# Patient Record
Sex: Female | Born: 1940 | Race: Black or African American | Hispanic: No | Marital: Single | Smoking: Former smoker
Health system: Southern US, Community
[De-identification: ages and names within clinical notes are randomized; demographics above are authoritative.]

## PROBLEM LIST (undated history)

## (undated) DIAGNOSIS — I639 Cerebral infarction, unspecified: Secondary | ICD-10-CM

## (undated) DIAGNOSIS — I509 Heart failure, unspecified: Secondary | ICD-10-CM

## (undated) DIAGNOSIS — I10 Essential (primary) hypertension: Secondary | ICD-10-CM

## (undated) DIAGNOSIS — Z9181 History of falling: Secondary | ICD-10-CM

## (undated) DIAGNOSIS — F0390 Unspecified dementia without behavioral disturbance: Secondary | ICD-10-CM

## (undated) DIAGNOSIS — J449 Chronic obstructive pulmonary disease, unspecified: Secondary | ICD-10-CM

## (undated) DIAGNOSIS — R011 Cardiac murmur, unspecified: Secondary | ICD-10-CM

## (undated) DIAGNOSIS — I219 Acute myocardial infarction, unspecified: Secondary | ICD-10-CM

## (undated) HISTORY — DX: Essential (primary) hypertension: I10

## (undated) HISTORY — DX: History of falling: Z91.81

## (undated) HISTORY — DX: Cerebral infarction, unspecified: I63.9

## (undated) HISTORY — PX: ABDOMINAL HYSTERECTOMY: SHX81

## (undated) HISTORY — DX: Acute myocardial infarction, unspecified: I21.9

## (undated) HISTORY — DX: Unspecified dementia, unspecified severity, without behavioral disturbance, psychotic disturbance, mood disturbance, and anxiety: F03.90

## (undated) HISTORY — DX: Cardiac murmur, unspecified: R01.1

---

## 2011-04-03 DIAGNOSIS — I509 Heart failure, unspecified: Secondary | ICD-10-CM | POA: Diagnosis not present

## 2011-04-03 DIAGNOSIS — J449 Chronic obstructive pulmonary disease, unspecified: Secondary | ICD-10-CM | POA: Diagnosis not present

## 2011-04-03 DIAGNOSIS — I10 Essential (primary) hypertension: Secondary | ICD-10-CM | POA: Diagnosis not present

## 2011-04-03 DIAGNOSIS — E785 Hyperlipidemia, unspecified: Secondary | ICD-10-CM | POA: Diagnosis not present

## 2011-07-05 DIAGNOSIS — R0602 Shortness of breath: Secondary | ICD-10-CM | POA: Diagnosis not present

## 2011-07-05 DIAGNOSIS — I10 Essential (primary) hypertension: Secondary | ICD-10-CM | POA: Diagnosis not present

## 2011-07-05 DIAGNOSIS — R0609 Other forms of dyspnea: Secondary | ICD-10-CM | POA: Diagnosis not present

## 2011-07-06 DIAGNOSIS — J438 Other emphysema: Secondary | ICD-10-CM | POA: Diagnosis not present

## 2011-07-06 DIAGNOSIS — R51 Headache: Secondary | ICD-10-CM | POA: Diagnosis not present

## 2011-07-07 DIAGNOSIS — R9431 Abnormal electrocardiogram [ECG] [EKG]: Secondary | ICD-10-CM | POA: Diagnosis not present

## 2011-07-07 DIAGNOSIS — R0602 Shortness of breath: Secondary | ICD-10-CM | POA: Diagnosis not present

## 2012-01-15 ENCOUNTER — Emergency Department (INDEPENDENT_AMBULATORY_CARE_PROVIDER_SITE_OTHER)
Admission: EM | Admit: 2012-01-15 | Discharge: 2012-01-15 | Disposition: A | Discharge status: Home / Self Care | Payer: Medicare Other | Source: Home / Self Care | Attending: Emergency Medicine | Admitting: Emergency Medicine

## 2012-01-15 ENCOUNTER — Encounter (HOSPITAL_COMMUNITY): Payer: Self-pay | Admitting: *Deleted

## 2012-01-15 DIAGNOSIS — I10 Essential (primary) hypertension: Secondary | ICD-10-CM

## 2012-01-15 DIAGNOSIS — J449 Chronic obstructive pulmonary disease, unspecified: Secondary | ICD-10-CM | POA: Diagnosis not present

## 2012-01-15 DIAGNOSIS — I509 Heart failure, unspecified: Secondary | ICD-10-CM

## 2012-01-15 HISTORY — DX: Heart failure, unspecified: I50.9

## 2012-01-15 HISTORY — DX: Chronic obstructive pulmonary disease, unspecified: J44.9

## 2012-01-15 MED ORDER — DONEPEZIL HCL 10 MG PO TABS: 10.0000 mg | ORAL_TABLET | Freq: Every evening | ORAL | Status: DC | PRN

## 2012-01-15 MED ORDER — CLONIDINE HCL 0.1 MG/24HR TD PTWK: 1.0000 | MEDICATED_PATCH | TRANSDERMAL | Status: DC

## 2012-01-15 MED ORDER — FUROSEMIDE 40 MG PO TABS: ORAL_TABLET | ORAL | Status: DC

## 2012-01-15 MED ORDER — CARVEDILOL PHOSPHATE ER 40 MG PO CP24: 40.0000 mg | ORAL_CAPSULE | Freq: Every day | ORAL | Status: DC

## 2012-01-15 MED ORDER — POTASSIUM CHLORIDE CRYS ER 20 MEQ PO TBCR: EXTENDED_RELEASE_TABLET | ORAL | Status: DC

## 2012-01-15 MED ORDER — LISINOPRIL 10 MG PO TABS: 10.0000 mg | ORAL_TABLET | Freq: Every day | ORAL | Status: DC

## 2012-01-15 MED ORDER — SIMVASTATIN 20 MG PO TABS: 20.0000 mg | ORAL_TABLET | Freq: Every evening | ORAL | Status: DC

## 2012-01-15 NOTE — ED Notes (Signed)
Pt     Short    Of  Breath         Has    History  Of  chf  In  Past  Recently   Moved  From   [place]          took  Lasix  yest             Pt    Sitting  Upright  On table  In  msome  Mild  Distress family  Member  At  Bedside

## 2012-01-15 NOTE — ED Provider Notes (Signed)
Chief Complaint  Patient presents with  . Shortness of Breath    History of Present Illness:   Carol Monroe is a 71 year old female who is visiting here from [place]. She comes in today with her daughter for refills on her medications. She is nearly out of a number of medications her daughter wants to get enough for several months since she may be staying here with her. She has a history of COPD, congestive heart failure, hypertension, hyperlipidemia, and dementia. She is on a number of medications as outlined below. She tolerated all these medications well. She and her daughter both say that she is at her baseline state right now. She does get fairly short of breath with minimal exertion, but not any more so than usual. She normally uses oxygen at home. She doesn't have any fever or chills. She has a cough productive of small amounts of white sputum but no wheezing. She denies any chest pain, pressure, or tightness. She's had no weakness, dizziness, or fainting. She denies any ankle edema, PND, orthopnea. Her weight has been stable.  Review of Systems:  Other than noted above, the patient denies any of the following symptoms. Systemic:  No fever, chills, sweats, fatigue, myalgias, headache, or anorexia. Eye:  No redness, pain or drainage. ENT:  No earache, nasal congestion, rhinorrhea, sinus pressure, or sore throat. Lungs:  No cough, sputum production, wheezing, shortness of breath.  Cardiovascular:  No chest pain, palpitations, or syncope. GI:  No nausea, vomiting, abdominal pain or diarrhea. GU:  No dysuria, frequency, or hematuria. Skin:  No rash or pruritis.  PMFSH:  Past medical history, family history, social history, meds, and allergies were reviewed.   Physical Exam:   Vital signs:  BP 189/95  Pulse 70  Temp 99.7 F (37.6 C) (Oral)  Resp 26  SpO2 94% General:  Alert, in no distress. Eye:  PERRL, full EOMs.  Lids and conjunctivas were normal. ENT:  TMs and canals were  normal, without erythema or inflammation.  Nasal mucosa was clear and uncongested, without drainage.  Mucous membranes were moist.  Pharynx was clear, without exudate or drainage.  There were no oral ulcerations or lesions. Neck:  Supple, no adenopathy, tenderness or mass. Thyroid was normal. Lungs:  No respiratory distress.  Lungs were clear to auscultation, without wheezes, rales or rhonchi.  Breath sounds were clear and equal bilaterally. Heart:  Regular rhythm, without gallops, murmers or rubs. Abdomen:  Soft, flat, and non-tender to palpation.  No hepatosplenomagaly or mass. Skin:  Clear, warm, and dry, without rash or lesions.  Assessment:  The primary encounter diagnosis was COPD (chronic obstructive pulmonary disease). Diagnoses of CHF (congestive heart failure) and Hypertension were also pertinent to this visit.  Plan:   1.  The following meds were prescribed:   New Prescriptions   CARVEDILOL (COREG CR) 40 MG 24 HR CAPSULE    Take 1 capsule (40 mg total) by mouth daily.   CLONIDINE (CATAPRES - DOSED IN MG/24 HR) 0.1 MG/24HR PATCH    Place 1 patch (0.1 mg total) onto the skin once a week.   DONEPEZIL (ARICEPT) 10 MG TABLET    Take 1 tablet (10 mg total) by mouth at bedtime as needed.   FUROSEMIDE (LASIX) 40 MG TABLET    One daily on Mon, Wed, Fri, and Sat.   LISINOPRIL (PRINIVIL,ZESTRIL) 10 MG TABLET    Take 1 tablet (10 mg total) by mouth daily.   POTASSIUM CHLORIDE SA (K-DUR,KLOR-CON) 20  MEQ TABLET    One daily Mon, Wed, Fri, and Sat.   SIMVASTATIN (ZOCOR) 20 MG TABLET    Take 1 tablet (20 mg total) by mouth every evening.   2.  The patient was instructed in symptomatic care and handouts were given. The daughter was told that if she is going to be staying here she'll need to find a primary care physician. She was given enough refills on all her meds to last for a month. The daughter knows if she should get worse in any way, particularly with increasing shortness of breath, change in  color of sputum, fever, chest pain, ankle edema, or weight gain, to take her to the emergency room. 3.  The patient was told to return if becoming worse in any way, if no better in 3 or 4 days, and given some red flag symptoms that would indicate earlier return.    Reuben Likes, MD 01/15/12 [phone]

## 2012-01-15 NOTE — ED Notes (Addendum)
Pt's O2 sats were 85% upon walking from waiting area to exam room 9. Resp rate & effort noted to be increased. Pt admits to being short of breath "just doing little walks." After pt sat approx 3 min, O2 sats highest were 94% on RA & RR down to 26. Dr. Lorenz Coaster made aware of this.

## 2012-04-06 DIAGNOSIS — Z0000 Encounter for general adult medical examination without abnormal findings: Secondary | ICD-10-CM | POA: Diagnosis not present

## 2012-04-09 ENCOUNTER — Ambulatory Visit: Payer: Self-pay | Admitting: Nurse Practitioner

## 2012-04-16 ENCOUNTER — Ambulatory Visit: Payer: Self-pay | Admitting: Nurse Practitioner

## 2012-04-24 ENCOUNTER — Encounter: Payer: Self-pay | Admitting: Nurse Practitioner

## 2012-04-24 ENCOUNTER — Ambulatory Visit (INDEPENDENT_AMBULATORY_CARE_PROVIDER_SITE_OTHER): Payer: Medicare Other | Admitting: Nurse Practitioner

## 2012-04-24 VITALS — BP 146/92 | HR 62 | Temp 98.6°F | Resp 22 | Ht 61.5 in | Wt 159.0 lb

## 2012-04-24 DIAGNOSIS — H6120 Impacted cerumen, unspecified ear: Secondary | ICD-10-CM

## 2012-04-24 DIAGNOSIS — I10 Essential (primary) hypertension: Secondary | ICD-10-CM | POA: Insufficient documentation

## 2012-04-24 DIAGNOSIS — R413 Other amnesia: Secondary | ICD-10-CM | POA: Diagnosis not present

## 2012-04-24 DIAGNOSIS — J449 Chronic obstructive pulmonary disease, unspecified: Secondary | ICD-10-CM

## 2012-04-24 DIAGNOSIS — I639 Cerebral infarction, unspecified: Secondary | ICD-10-CM

## 2012-04-24 DIAGNOSIS — I6350 Cerebral infarction due to unspecified occlusion or stenosis of unspecified cerebral artery: Secondary | ICD-10-CM | POA: Diagnosis not present

## 2012-04-24 DIAGNOSIS — F0390 Unspecified dementia without behavioral disturbance: Secondary | ICD-10-CM

## 2012-04-24 DIAGNOSIS — I509 Heart failure, unspecified: Secondary | ICD-10-CM | POA: Insufficient documentation

## 2012-04-24 DIAGNOSIS — H6123 Impacted cerumen, bilateral: Secondary | ICD-10-CM

## 2012-04-24 MED ORDER — DONEPEZIL HCL 10 MG PO TABS: 10.0000 mg | ORAL_TABLET | Freq: Every day | ORAL | Status: DC

## 2012-04-24 MED ORDER — CLONIDINE HCL 0.1 MG/24HR TD PTWK: 1.0000 | MEDICATED_PATCH | TRANSDERMAL | Status: DC

## 2012-04-24 NOTE — Assessment & Plan Note (Signed)
Wax removal done. Pt tolerated well.

## 2012-04-24 NOTE — Progress Notes (Addendum)
Patient ID: Carol Monroe, female   DOB: 08/23/1940, 72 y.o.   MRN: [id number] Code Status: Full code   No Known Allergies  Chief Complaint  Patient presents with  . NP to Establish    HPI: Patient is a 72 y.o. female seen in the office today to establish care Previously lived in [place] with her other daughter (pt has 4 daughters)  Came to [place] to visit but now she plans on staying Daughter here with pt during the visit and reports insurance is here is not covering the same medications   Review of Systems:  Review of Systems  Constitutional: Negative for fever, chills, weight loss and malaise/fatigue.  HENT: Negative for hearing loss, ear pain, nosebleeds, congestion, sore throat and tinnitus.   Eyes: Positive for blurred vision (ongoing- gradual loss of vision). Negative for double vision, pain, discharge and redness.  Respiratory: Negative for cough, sputum production and shortness of breath.   Cardiovascular: Negative for chest pain, palpitations, orthopnea, claudication and leg swelling.  Gastrointestinal: Negative for heartburn, nausea, vomiting, abdominal pain, diarrhea and constipation.  Genitourinary: Positive for frequency (on diuretics ).       Some urge incontinence   Musculoskeletal: Negative.   Skin: Negative for itching and rash.  Neurological: Negative for dizziness, tingling, weakness and headaches.       Hx of carpel tunnel   Psychiatric/Behavioral: Positive for memory loss. Negative for depression. The patient has insomnia (watches TV at night). The patient is not nervous/anxious.      Past Medical History  Diagnosis Date  . CHF (congestive heart failure)   . COPD (chronic obstructive pulmonary disease)   . Stroke   . Hypertension   . Heart attack   . Murmur, heart   . Dementia    Past Surgical History  Procedure Laterality Date  . Abdominal hysterectomy      Partial    Social History:   reports that she quit smoking about 6 years ago. Her smoking use  included Cigarettes. She has a 80 pack-year smoking history. She does not have any smokeless tobacco history on file. She reports that she does not drink alcohol or use illicit drugs.  Family History  Problem Relation Age of Onset  . Heart disease Mother   . Diabetes Mother   . Diabetes Father     Medications: Patient's Medications  New Prescriptions   No medications on file  Previous Medications   CARVEDILOL (COREG) 12.5 MG TABLET    Take one tablet by mouth once twice daily   CLONIDINE (CATAPRES - DOSED IN MG/24 HR) 0.1 MG/24HR PATCH    Place 1 patch onto the skin once a week.   DONEPEZIL (ARICEPT) 10 MG TABLET    Take 10 mg by mouth at bedtime as needed.   FUROSEMIDE (LASIX) 40 MG TABLET    One daily on Mon, Wed, Fri, and Sat.   POTASSIUM CHLORIDE SA (K-DUR,KLOR-CON) 20 MEQ TABLET    One daily Mon, Wed, Fri, and Sat.   SIMVASTATIN (ZOCOR) 20 MG TABLET    Take 20 mg by mouth every evening.   TIOTROPIUM (SPIRIVA HANDIHALER) 18 MCG INHALATION CAPSULE    Place 18 mcg into inhaler and inhale daily.  Modified Medications   Modified Medication Previous Medication   LISINOPRIL (PRINIVIL,ZESTRIL) 10 MG TABLET lisinopril (PRINIVIL,ZESTRIL) 10 MG tablet      Take 40 mg by mouth daily.    Take 1 tablet (10 mg total) by mouth daily.  Discontinued Medications  CARVEDILOL (COREG CR) 40 MG 24 HR CAPSULE    Take 40 mg by mouth daily.   CARVEDILOL (COREG CR) 40 MG 24 HR CAPSULE    Take 1 capsule (40 mg total) by mouth daily.   CLONIDINE (CATAPRES - DOSED IN MG/24 HR) 0.1 MG/24HR PATCH    Place 1 patch (0.1 mg total) onto the skin once a week.   DONEPEZIL (ARICEPT) 10 MG TABLET    Take 1 tablet (10 mg total) by mouth at bedtime as needed.   FUROSEMIDE (LASIX) 40 MG TABLET    Take 40 mg by mouth daily. mwfs   LISINOPRIL (PRINIVIL,ZESTRIL) 10 MG TABLET    Take 40 mg by mouth daily.   POTASSIUM CHLORIDE (KLOR-CON PO)    Take by mouth.   SIMVASTATIN (ZOCOR) 20 MG TABLET    Take 1 tablet (20 mg total)  by mouth every evening.     Physical Exam: Physical Exam  Constitutional: She appears well-developed and well-nourished. No distress.  HENT:  Head: Normocephalic and atraumatic.  Right Ear: External ear normal.  Left Ear: External ear normal.  Bilateral cerumen impaction   Eyes: Conjunctivae and EOM are normal. Pupils are equal, round, and reactive to light.  Neck: Normal range of motion. Neck supple.  Cardiovascular: Normal rate, regular rhythm and normal heart sounds.   Pulmonary/Chest: Effort normal and breath sounds normal.  Abdominal: Soft. Bowel sounds are normal.  Musculoskeletal: Normal range of motion.  Neurological: She is alert. She has normal reflexes.  Skin: Skin is warm and dry. She is not diaphoretic.  Psychiatric: She has a normal mood and affect.    Filed Vitals:   04/24/12 1355 04/24/12 1457  BP: 168/100 146/92  Pulse: 62   Temp: 98.6 F (37 C)   TempSrc: Oral   Resp: 22   Height: 5' 1.5" (1.562 m)   Weight: 159 lb (72.122 kg)   SpO2: 95%    Assessment/Plan Hypertension BP high today however she has not taken any of her BP medications. Will have pt take bp out of the office and bring log to ext visit to call if sbp remaining over 150 once she takes her medication.   Stroke With no residuals- will request records from previous PCP  CHF (congestive heart failure) Patient is stable; continue current regimen. Will monitor and make changes as necessary.  COPD (chronic obstructive pulmonary disease) Patient is stable; continue current regimen. Will monitor and make changes as necessary.   Dementia Stable-- currently on aricept Will get labs and MMSE at [facility]  Cerumen impaction  Wax removal done. Pt tolerated well.

## 2012-04-24 NOTE — Patient Instructions (Signed)
Cardiac Diet This diet can help prevent heart disease and stroke. Many factors influence your heart health, including eating and exercise habits. Coronary risk rises a lot with abnormal blood fat (lipid) levels. Cardiac meal planning includes limiting unhealthy fats, increasing healthy fats, and making other small dietary changes. General guidelines are as follows:  Adjust calorie intake to reach and maintain desirable body weight.  Limit total fat intake to less than 30% of total calories. Saturated fat should be less than 7% of calories.  Saturated fats are found in animal products and in some vegetable products. Saturated vegetable fats are found in coconut oil, cocoa butter, palm oil, and palm kernel oil. Read labels carefully to avoid these products as much as possible. Use butter in moderation. Choose tub margarines and oils that have 2 grams of fat or less. Good cooking oils are canola and olive oils.  Practice low-fat cooking techniques. Do not fry food. Instead, broil, bake, boil, steam, grill, roast on a rack, stir-fry, or microwave it. Other fat reducing suggestions include:  Remove the skin from poultry.  Remove all visible fat from meats.  Skim the fat off stews, soups, and gravies before serving them.  Steam vegetables in water or broth instead of sauting them in fat.  Avoid foods with trans fat (or hydrogenated oils), such as commercially fried foods and commercially baked goods. Commercial shortening and deep-frying fats will contain trans fat.  Increase intake of fruits, vegetables, whole grains, and legumes to replace foods high in fat.  Increase consumption of nuts, legumes, and seeds to at least 4 servings weekly. One serving of a legume equals  cup, and 1 serving of nuts or seeds equals  cup.  Choose whole grains more often. Have 3 servings per day (a serving is 1 ounce [oz]).  Eat 4 to 5 servings of vegetables per day. A serving of vegetables is 1 cup of raw leafy  vegetables;  cup of raw or cooked cut-up vegetables;  cup of vegetable juice.  Eat 4 to 5 servings of fruit per day. A serving of fruit is 1 medium whole fruit;  cup of dried fruit;  cup of fresh, frozen, or canned fruit;  cup of 100% fruit juice.  Increase your intake of dietary fiber to 20 to 30 grams per day. Insoluble fiber may help lower your risk of heart disease and may help curb your appetite. Soluble fiber binds cholesterol to be removed from the blood. Foods high in soluble fiber are dried beans, citrus fruits, oats, apples, bananas, broccoli, Brussels sprouts, and eggplant.  Try to include foods fortified with plant sterols or stanols, such as yogurt, breads, juices, or margarines. Choose several fortified foods to achieve a daily intake of 2 to 3 grams of plant sterols or stanols.  Foods with omega-3 fats can help reduce your risk of heart disease. Aim to have a 3.5 oz portion of fatty fish twice per week, such as salmon, mackerel, albacore tuna, sardines, lake trout, or herring. If you wish to take a fish oil supplement, choose one that contains 1 gram of both DHA and EPA.  Limit processed meats to 2 servings (3 oz portion) weekly.  Limit the sodium in your diet to 1500 milligrams (mg) per day. If you have high blood pressure, talk to a registered dietitian about a DASH (Dietary Approaches to Stop Hypertension) eating plan.  Limit sweets and beverages with added sugar, such as soda, to no more than 5 servings per week. One serving   is:   1 tablespoon sugar.  1 tablespoon jelly or jam.   cup sorbet.  1 cup lemonade.   cup regular soda. CHOOSING FOODS Starches  Allowed: Breads: All kinds (wheat, rye, raisin, white, oatmeal, Italian, French, and English muffin bread). Low-fat rolls: English muffins, frankfurter and hamburger buns, bagels, pita bread, tortillas (not fried). Pancakes, waffles, biscuits, and muffins made with recommended oil.  Avoid: Products made with  saturated or trans fats, oils, or whole milk products. Butter rolls, cheese breads, croissants. Commercial doughnuts, muffins, sweet rolls, biscuits, waffles, pancakes, store-bought mixes. Crackers  Allowed: Low-fat crackers and snacks: Animal, graham, rye, saltine (with recommended oil, no lard), oyster, and matzo crackers. Bread sticks, melba toast, rusks, flatbread, pretzels, and light popcorn.  Avoid: High-fat crackers: cheese crackers, butter crackers, and those made with coconut, palm oil, or trans fat (hydrogenated oils). Buttered popcorn. Cereals  Allowed: Hot or cold whole-grain cereals.  Avoid: Cereals containing coconut, hydrogenated vegetable fat, or animal fat. Potatoes / Pasta / Rice  Allowed: All kinds of potatoes, rice, and pasta (such as macaroni, spaghetti, and noodles).  Avoid: Pasta or rice prepared with cream sauce or high-fat cheese. Chow mein noodles, French fries. Vegetables  Allowed: All vegetables and vegetable juices.  Avoid: Fried vegetables. Vegetables in cream, butter, or high-fat cheese sauces. Limit coconut. Fruit in cream or custard. Protein  Allowed: Limit your intake of meat, seafood, and poultry to no more than 6 oz (cooked weight) per day. All lean, well-trimmed beef, veal, pork, and lamb. All chicken and turkey without skin. All fish and shellfish. Wild game: wild duck, rabbit, pheasant, and venison. Egg whites or low-cholesterol egg substitutes may be used as desired. Meatless dishes: recipes with dried beans, peas, lentils, and tofu (soybean curd). Seeds and nuts: all seeds and most nuts.  Avoid: Prime grade and other heavily marbled and fatty meats, such as short ribs, spare ribs, rib eye roast or steak, frankfurters, sausage, bacon, and high-fat luncheon meats, mutton. Caviar. Commercially fried fish. Domestic duck, goose, venison sausage. Organ meats: liver, gizzard, heart, chitterlings, brains, kidney, sweetbreads. Dairy  Allowed: Low-fat  cheeses: nonfat or low-fat cottage cheese (1% or 2% fat), cheeses made with part skim milk, such as mozzarella, farmers, string, or ricotta. (Cheeses should be labeled no more than 2 to 6 grams fat per oz.). Skim (or 1%) milk: liquid, powdered, or evaporated. Buttermilk made with low-fat milk. Drinks made with skim or low-fat milk or cocoa. Chocolate milk or cocoa made with skim or low-fat (1%) milk. Nonfat or low-fat yogurt.  Avoid: Whole milk cheeses, including colby, cheddar, muenster, Monterey Jack, Havarti, Brie, Camembert, American, Swiss, and blue. Creamed cottage cheese, cream cheese. Whole milk and whole milk products, including buttermilk or yogurt made from whole milk, drinks made from whole milk. Condensed milk, evaporated whole milk, and 2% milk. Soups and Combination Foods  Allowed: Low-fat low-sodium soups: broth, dehydrated soups, homemade broth, soups with the fat removed, homemade cream soups made with skim or low-fat milk. Low-fat spaghetti, lasagna, chili, and Spanish rice if low-fat ingredients and low-fat cooking techniques are used.  Avoid: Cream soups made with whole milk, cream, or high-fat cheese. All other soups. Desserts and Sweets  Allowed: Sherbet, fruit ices, gelatins, meringues, and angel food cake. Homemade desserts with recommended fats, oils, and milk products. Jam, jelly, honey, marmalade, sugars, and syrups. Pure sugar candy, such as gum drops, hard candy, jelly beans, marshmallows, mints, and small amounts of dark chocolate.  Avoid: Commercially prepared cakes,   pies, cookies, frosting, pudding, or mixes for these products. Desserts containing whole milk products, chocolate, coconut, lard, palm oil, or palm kernel oil. Ice cream or ice cream drinks. Candy that contains chocolate, coconut, butter, hydrogenated fat, or unknown ingredients. Buttered syrups. Fats and Oils  Allowed: Vegetable oils: safflower, sunflower, corn, soybean, cottonseed, sesame, canola, olive,  or peanut. Non-hydrogenated margarines. Salad dressing or mayonnaise: homemade or commercial, made with a recommended oil. Low or nonfat salad dressing or mayonnaise.  Limit added fats and oils to 6 to 8 tsp per day (includes fats used in cooking, baking, salads, and spreads on bread). Remember to count the "hidden fats" in foods.  Avoid: Solid fats and shortenings: butter, lard, salt pork, bacon drippings. Gravy containing meat fat, shortening, or suet. Cocoa butter, coconut. Coconut oil, palm oil, palm kernel oil, or hydrogenated oils: these ingredients are often used in bakery products, nondairy creamers, whipped toppings, candy, and commercially fried foods. Read labels carefully. Salad dressings made of unknown oils, sour cream, or cheese, such as blue cheese and Roquefort. Cream, all kinds: half-and-half, light, heavy, or whipping. Sour cream or cream cheese (even if "light" or low-fat). Nondairy cream substitutes: coffee creamers and sour cream substitutes made with palm, palm kernel, hydrogenated oils, or coconut oil. Beverages  Allowed: Coffee (regular or decaffeinated), tea. Diet carbonated beverages, mineral water. Alcohol: Check with your caregiver. Moderation is recommended.  Avoid: Whole milk, regular sodas, and juice drinks with added sugar. Condiments  Allowed: All seasonings and condiments. Cocoa powder. "Cream" sauces made with recommended ingredients.  Avoid: Carob powder made with hydrogenated fats. SAMPLE MENU Breakfast   cup orange juice   cup oatmeal  1 slice toast  1 tsp margarine  1 cup skim milk Lunch  Turkey sandwich with 2 oz turkey, 2 slices bread  Lettuce and tomato slices  Fresh fruit  Carrot sticks  Coffee or tea Snack  Fresh fruit or low-fat crackers Dinner  3 oz lean ground beef  1 baked potato  1 tsp margarine   cup asparagus  Lettuce salad  1 tbs non-creamy dressing   cup peach slices  1 cup skim milk Document Released:  10/19/2007 Document Revised: 07/11/2011 Document Reviewed: 04/04/2011 [employer] Patient Information 2013 [employer].  

## 2012-04-24 NOTE — Assessment & Plan Note (Signed)
With no residuals- will request records from previous PCP

## 2012-04-24 NOTE — Assessment & Plan Note (Signed)
Patient is stable; continue current regimen. Will monitor and make changes as necessary.  

## 2012-04-24 NOTE — Assessment & Plan Note (Signed)
BP high today however she has not taken any of her BP medications. Will have pt take bp out of the office and bring log to ext visit to call if sbp remaining over 150 once she takes her medication.

## 2012-04-24 NOTE — Assessment & Plan Note (Signed)
Stable-- currently on aricept Will get labs and MMSE at [facility]

## 2012-04-25 ENCOUNTER — Telehealth: Payer: Self-pay | Admitting: Nurse Practitioner

## 2012-04-25 LAB — COMPREHENSIVE METABOLIC PANEL
ALT: 13 IU/L (ref 0–32)
AST: 18 IU/L (ref 0–40)
Albumin/Globulin Ratio: 1.1 (ref 1.1–2.5)
GFR calc Af Amer: 64 mL/min/{1.73_m2} (ref >59)
GFR calc non Af Amer: 55 mL/min/{1.73_m2} — ABNORMAL LOW (ref >59)
Potassium: 5 mmol/L (ref 3.5–5.2)
Sodium: 146 mmol/L — ABNORMAL HIGH (ref 134–144)
Total Bilirubin: 0.4 mg/dL (ref 0.0–1.2)

## 2012-04-25 LAB — CBC WITH DIFFERENTIAL/PLATELET
Eosinophils Absolute: 0.1 10*3/uL (ref 0.0–0.4)
Immature Grans (Abs): 0 10*3/uL (ref 0.0–0.1)
Lymphs: 33 % (ref 14–46)
MCHC: 31.5 g/dL (ref 31.5–35.7)
Monocytes Absolute: 0.7 10*3/uL (ref 0.1–0.9)
Monocytes: 9 % (ref 4–12)
Neutrophils Absolute: 4.4 10*3/uL (ref 1.4–7.0)
RDW: 16.4 % — ABNORMAL HIGH (ref 12.3–15.4)

## 2012-04-25 NOTE — Telephone Encounter (Signed)
Err

## 2012-04-25 NOTE — Addendum Note (Signed)
Addended by: Claudie Revering on: 04/25/2012 11:05 AM   Modules accepted: Orders

## 2012-04-30 ENCOUNTER — Telehealth: Payer: Self-pay | Admitting: Nurse Practitioner

## 2012-04-30 NOTE — Telephone Encounter (Signed)
Sent request for Medical Record in [place].  Says they will charge a fee of .75 per pg- $140.00.  Spoke / Mrs. Chagnon, informed her we do would not pay for M/R.  Says she will speak w/ her daughter...[facility]

## 2012-05-22 ENCOUNTER — Encounter: Payer: Self-pay | Admitting: Nurse Practitioner

## 2012-05-22 ENCOUNTER — Ambulatory Visit (INDEPENDENT_AMBULATORY_CARE_PROVIDER_SITE_OTHER): Payer: Medicare Other | Admitting: Nurse Practitioner

## 2012-05-22 VITALS — BP 144/86 | HR 83 | Temp 98.3°F | Resp 15 | Ht 61.5 in | Wt 164.4 lb

## 2012-05-22 DIAGNOSIS — F0390 Unspecified dementia without behavioral disturbance: Secondary | ICD-10-CM | POA: Diagnosis not present

## 2012-05-22 DIAGNOSIS — J449 Chronic obstructive pulmonary disease, unspecified: Secondary | ICD-10-CM

## 2012-05-22 DIAGNOSIS — I509 Heart failure, unspecified: Secondary | ICD-10-CM | POA: Diagnosis not present

## 2012-05-22 DIAGNOSIS — I10 Essential (primary) hypertension: Secondary | ICD-10-CM | POA: Diagnosis not present

## 2012-05-22 DIAGNOSIS — E785 Hyperlipidemia, unspecified: Secondary | ICD-10-CM

## 2012-05-22 MED ORDER — FUROSEMIDE 40 MG PO TABS: ORAL_TABLET | ORAL | Status: DC

## 2012-05-22 MED ORDER — ALBUTEROL SULFATE HFA 108 (90 BASE) MCG/ACT IN AERS: 2.0000 | INHALATION_SPRAY | Freq: Four times a day (QID) | RESPIRATORY_TRACT | Status: DC | PRN

## 2012-05-22 MED ORDER — CARVEDILOL PHOSPHATE ER 40 MG PO CP24: 40.0000 mg | ORAL_CAPSULE | Freq: Every day | ORAL | Status: DC

## 2012-05-22 NOTE — Assessment & Plan Note (Signed)
Pt has been taking coreg 40mg  ER. Will cont with this Rx

## 2012-05-22 NOTE — Progress Notes (Signed)
Failed clock drawing  

## 2012-05-22 NOTE — Assessment & Plan Note (Signed)
Has been on aricept 10mg  for several years. Will start namenda XR at this time.

## 2012-05-22 NOTE — Patient Instructions (Signed)
Cont to weight daily and notify if 3 lbs weight gain in 1 day or 5 lbs in 1 week  Will consult cardiology Need to make dental appt  Will follow up in 3 month for EV Labs in the next few weeks whenever you are able to come in    Chronic Obstructive Pulmonary Disease Chronic obstructive pulmonary disease (COPD) is a condition in which airflow from the lungs is restricted. The lungs can never return to normal, but there are measures you can take which will improve them and make you feel better. CAUSES   Smoking.  Exposure to secondhand smoke.  Breathing in irritants (pollution, cigarette smoke, strong smells, aerosol sprays, paint fumes).  History of lung infections. TREATMENT  Treatment focuses on making you comfortable (supportive care). Your caregiver may prescribe medications (inhaled or pills) to help improve your breathing. HOME CARE INSTRUCTIONS   If you smoke, stop smoking.  Avoid exposure to smoke, chemicals, and fumes that aggravate your breathing.  Take antibiotic medicines as directed by your caregiver.  Avoid medicines that dry up your system and slow down the elimination of secretions (antihistamines and cough syrups). This decreases respiratory capacity and may lead to infections.  Drink enough water and fluids to keep your urine clear or pale yellow. This loosens secretions.  Use humidifiers at home and at your bedside if they do not make breathing difficult.  Receive all protective vaccines your caregiver suggests, especially pneumococcal and influenza.  Use home oxygen as suggested.  Stay active. Exercise and physical activity will help maintain your ability to do things you want to do.  Eat a healthy diet. SEEK MEDICAL CARE IF:   You develop pus-like mucus (sputum).  Breathing is more labored or exercise becomes difficult to do.  You are running out of the medicine you take for your breathing. SEEK IMMEDIATE MEDICAL CARE IF:   You have a rapid heart  rate.  You have agitation, confusion, tremors, or are in a stupor (family members may need to observe this).  It becomes difficult to breathe.  You develop chest pain.  You have a fever. MAKE SURE YOU:   Understand these instructions.  Will watch your condition.  Will get help right away if you are not doing well or get worse. Document Released: 10/19/2004 Document Revised: 04/03/2011 Document Reviewed: 03/11/2010 [facility] Patient Information 2013 [place], [place].   Heart Failure Heart failure (HF) is a condition in which the heart has trouble pumping blood. This means your heart does not pump blood efficiently for your body to work well. In some cases of HF, fluid may back up into your lungs or you may have swelling (edema) in your lower legs. HF is a long-term (chronic) condition. It is important for you to take good care of yourself and follow your caregiver's treatment plan. CAUSES   Health conditions:  High blood pressure (hypertension) causes the heart muscle to work harder than normal. When pressure in the blood vessels is high, the heart needs to pump (contract) with more force in order to circulate blood throughout the body. High blood pressure eventually causes the heart to become stiff and weak.  Coronary artery disease (CAD) is the buildup of cholesterol and fat (plaques) in the arteries of the heart. The blockage in the arteries deprives the heart muscle of oxygen and blood. This can cause chest pain and may lead to a heart attack. High blood pressure can also contribute to CAD.  Heart attack (myocardial infarction) occurs when  1 or more arteries in the heart become blocked. The loss of oxygen damages the muscle tissue of the heart. When this happens, part of the heart muscle dies. The injured tissue does not contract as well and weakens the heart's ability to pump blood.  Abnormal heart valves can cause HF when the heart valves do not open and close properly. This makes  the heart muscle pump harder to keep the blood flowing.  Heart muscle disease (cardiomyopathy or myocarditis) is damage to the heart muscle from a variety of causes. These can include drug or alcohol abuse, infections, or unknown reasons. These can increase the risk of HF.  Lung disease makes the heart work harder because the lungs do not work properly. This can cause a strain on the heart leading it to fail.  Diabetes increases the risk of HF. High blood sugar contributes to high fat (lipid) levels in the blood. Diabetes can also cause slow damage to tiny blood vessels that carry important nutrients to the heart muscle. When the heart does not get enough oxygen and food, it can cause the heart to become weak and stiff. This leads to a heart that does not contract efficiently.  Other diseases can contribute to HF. These include abnormal heart rhythms, thyroid problems, and low blood counts (anemia).  Unhealthy lifestyle habits:  Obesity.  Smoking.  Eating foods high in fat and cholesterol.  Eating or drinking beverages high in salt.  Drug or alcohol abuse.  Lack of exercise. SYMPTOMS  HF symptoms may vary and can be hard to detect. Symptoms may include:  Shortness of breath with activity, such as climbing stairs.  Persistent cough.  Swelling of the feet, ankles, legs, or abdomen.  Unexplained weight gain.  Difficulty breathing when lying flat.  Waking from sleep because of the need to sit up and get more air.  Rapid heartbeat.  Fatigue and loss of energy.  Feeling lightheaded or close to fainting. DIAGNOSIS  A diagnosis of HF is based on your history, symptoms, physical examination, and diagnostic tests. Diagnostic tests for HF may include:  EKG.  Chest X-ray.  Blood tests.  Exercise stress test.  Blood oxygen test (arterial blood gas).  Evaluation by a heart doctor (cardiologist).  Ultrasound evaluation of the heart (echocardiogram).  Heart artery test to  look for blockages (angiogram).  Radioactive imaging to look at the heart (radionuclide test). TREATMENT  Treatment is aimed at managing the symptoms of HF. Medicines, lifestyle changes, or surgical intervention may be necessary to treat HF.  Medicines to help treat HF may include:  Angiotensin-converting enzyme (ACE) inhibitors. These block the effects of a blood protein called angiotensin-converting enzyme. ACE inhibitors relax (dilate) the blood vessels and help lower blood pressure. This decreases the workload of the heart, slows the progression of HF, and improves symptoms.  Angiotensin receptor blockers (ARBs). These medications work similar to ACE inhibitors. ARBs may be an alternative for people who cannot tolerate an ACE inhibitor.  Aldosterone antagonists. This medication helps get rid of extra fluid from your body. This lowers the volume of blood the heart has to pump.  Water pills (diuretics). Diuretics cause the kidneys to remove salt and water from the blood. The extra fluid is removed by urination. By removing extra fluid from the body, diuretics help lower the workload of the heart and help prevent fluid buildup in the lungs so breathing is easier.  Beta blockers. These prevent the heart from beating too fast and improve heart muscle  strength. Beta blockers help maintain a normal heart rate, control blood pressure, and improve HF symptoms.  Digitalis. This increases the force of the heartbeat and may be helpful to people with HF or heart rhythm problems.  Healthy lifestyle changes include:  Stopping smoking.  Eating a healthy diet. Avoid foods high in fat. Avoid foods fried in oil or made with fat. A dietician can help with healthy food choices.  Limiting how much salt you eat.  Limiting alcohol intake to no more than 1 drink per day for women and 2 drinks per day for men. Drinking more than that is harmful to your heart. If your heart has already been damaged by alcohol  or you have severe HF, drinking alcohol should be stopped completely.  Exercising as directed by your caregiver.  Surgical treatment for HF may include:  Procedures to open blocked arteries, repair damaged heart valves, or remove damaged heart muscle tissue.  A pacemaker to help heart muscle function and to control certain abnormal heart rhythms.  A defibrillator to possibly prevent sudden cardiac death. HOME CARE INSTRUCTIONS   Activity level. Your caregiver can help you determine what type of exercise program may be helpful. It is important to maintain your strength. Pace your physical activity to avoid shortness of breath or chest pain. Rest for 1 hour before and after meals. A cardiac rehabilitation program may be helpful to some people with HF.  Diet. Eat a heart healthy diet. Food choices should be low in saturated fat and cholesterol. Talk to a dietician to learn about heart healthy foods.  Salt intake. When you have HF, you need to limit the amount of salt you eat. Eat less than 1500 milligrams (mg) of salt per day or as recommended by your caregiver.  Weight monitoring. Weigh yourself every day. You should weigh yourself in the morning after you urinate and before you eat breakfast. Wear the same amount of clothing each time you weigh yourself. Record your weight daily. Bring your recorded weights to your clinic visits. Tell your caregiver right away if you have gained 3 lb/1.4 kg in 1 day, or 5 lb/2.3 kg in a week or whatever amount you were told to report.  Blood pressure monitoring. This should be done as directed by your caregiver. A home blood pressure cuff can be purchased at a drugstore. Record your blood pressure numbers and bring them to your clinic visits. Tell your caregiver if you become dizzy or lightheaded upon standing up.  Smoking. If you are currently a smoker, it is time to quit. Nicotine makes your heart work harder by causing your blood vessels to constrict. Do not  use nicotine gum or patches before talking to your caregiver.  Follow up. Be sure to schedule a follow-up visit with your caregiver. Keep all your appointments. SEEK MEDICAL CARE IF:   Your weight increases by 3 lb/1.4 kg in 1 day or 5 lb/2.3 kg in a week.  You notice increasing shortness of breath that is unusual for you. This may happen during rest, sleep, or with activity.  You cough more than normal, especially with physical activity.  You notice more swelling in your hands, feet, ankles, or belly (abdomen).  You are unable to sleep because it is hard to breathe.  You cough up bloody mucus (sputum).  You begin to feel "jumping" or "fluttering" sensations (palpitations) in your chest. SEEK IMMEDIATE MEDICAL CARE IF:   You have severe chest pain or pressure which may include symptoms  such as:  Pain or pressure in the arms, neck, jaw, or back.  Feeling sweaty.  Feeling sick to your stomach (nauseous).  Feeling short of breath while at rest.  Having a fast or irregular heartbeat.  You experience stroke symptoms. These symptoms include:  Facial weakness or numbness.  Weakness or numbness in an arm, leg, or on one side of your body.  Blurred vision.  Difficulty talking or thinking.  Dizziness or fainting.  Severe headache. MAKE SURE YOU:   Understand these instructions.  Will watch your condition.  Will get help right away if you are not doing well or get worse. Document Released: 01/09/2005 Document Revised: 07/11/2011 Document Reviewed: 04/23/2009 [facility] Patient Information 2013 [place], [place].

## 2012-05-22 NOTE — Assessment & Plan Note (Signed)
Daughter weights mother daily- did not weigh today or take lasix- take lasix 40mg  m, w, f, and Saturday Will get a cardiology consult so pt can establish care with cardiology in [place]

## 2012-05-22 NOTE — Assessment & Plan Note (Signed)
Stable on current medications does not have rescue inhaler- will add albuterol

## 2012-05-22 NOTE — Progress Notes (Signed)
Patient ID: Carol Monroe, female   DOB: 23-Jul-1940, 72 y.o.   MRN: [id number]  No Known Allergies   Chief Complaint  Patient presents with  . Medical Managment of Chronic Issues    HPI: Patient is a 72 y.o. female seen in the office today for routine visit. Daughter reports pt has been inconsistent with taking spiriva and also questions weight gain. Did not take weight today.  Is taking coreg 40mg  ER daily and needs refill on this   Review of Systems:  Review of Systems  Constitutional: Negative for weight loss.  Respiratory: Negative for cough and shortness of breath.   Cardiovascular: Negative for chest pain, palpitations and leg swelling.  Gastrointestinal: Negative for abdominal pain, diarrhea and constipation.  Genitourinary: Positive for frequency (due to lasix). Negative for dysuria and urgency.  Musculoskeletal: Negative for myalgias and back pain.  Skin: Negative.   Neurological: Negative for dizziness and weakness.  Psychiatric/Behavioral: Negative for depression. The patient is not nervous/anxious and does not have insomnia.   has memory loss per daughter- no changes in this at this time. Still able to function in her house. No behaviors at this time.    Past Medical History  Diagnosis Date  . CHF (congestive heart failure)   . COPD (chronic obstructive pulmonary disease)   . Stroke   . Hypertension   . Heart attack   . Murmur, heart   . Dementia    Past Surgical History  Procedure Laterality Date  . Abdominal hysterectomy      Partial    Social History:   reports that she quit smoking about 6 years ago. Her smoking use included Cigarettes. She has a 80 pack-year smoking history. She does not have any smokeless tobacco history on file. She reports that she does not drink alcohol or use illicit drugs.  Family History  Problem Relation Age of Onset  . Heart disease Mother   . Diabetes Mother   . Diabetes Father     Medications: Patient's Medications  New  Prescriptions   ALBUTEROL (PROVENTIL HFA;VENTOLIN HFA) 108 (90 BASE) MCG/ACT INHALER    Inhale 2 puffs into the lungs every 6 (six) hours as needed for wheezing (as needed for shortness of breath and/or wheezing).  Previous Medications   CLONIDINE (CATAPRES - DOSED IN MG/24 HR) 0.1 MG/24HR PATCH    Place 1 patch (0.1 mg total) onto the skin once a week.   DONEPEZIL (ARICEPT) 10 MG TABLET    Take 1 tablet (10 mg total) by mouth at bedtime.   LISINOPRIL (PRINIVIL,ZESTRIL) 10 MG TABLET    Take 40 mg by mouth daily.   POTASSIUM CHLORIDE SA (K-DUR,KLOR-CON) 20 MEQ TABLET    One daily Mon, Wed, Fri, and Sat.   SIMVASTATIN (ZOCOR) 20 MG TABLET    Take 20 mg by mouth every evening.   TIOTROPIUM (SPIRIVA HANDIHALER) 18 MCG INHALATION CAPSULE    Place 18 mcg into inhaler and inhale daily.  Modified Medications   Modified Medication Previous Medication   CARVEDILOL (COREG CR) 40 MG 24 HR CAPSULE carvedilol (COREG CR) 40 MG 24 hr capsule      Take 1 capsule (40 mg total) by mouth daily.    Take 40 mg by mouth daily.   FUROSEMIDE (LASIX) 40 MG TABLET furosemide (LASIX) 40 MG tablet      One daily on Mon, Wed, Fri, and Sat.    One daily on Mon, Wed, Fri, and Sat.  Discontinued Medications   CARVEDILOL (  COREG) 12.5 MG TABLET    Take one tablet by mouth once twice daily     Physical Exam: Physical Exam  Constitutional: She is well-developed, well-nourished, and in no distress. No distress.  HENT:  Head: Normocephalic and atraumatic.  Right Ear: External ear normal.  Left Ear: External ear normal.  Nose: Nose normal.  Mouth/Throat: Oropharynx is clear and moist. No oropharyngeal exudate.  Eyes: Conjunctivae and EOM are normal. Pupils are equal, round, and reactive to light.  Neck: Normal range of motion. Neck supple. No tracheal deviation present. No thyromegaly present.  Cardiovascular: Normal rate, regular rhythm and normal heart sounds.   Pulmonary/Chest: Effort normal and breath sounds normal. No  respiratory distress.  Abdominal: Soft. Bowel sounds are normal. She exhibits no distension. There is no tenderness.  Musculoskeletal: Normal range of motion. She exhibits no edema and no tenderness.  Lymphadenopathy:    She has no cervical adenopathy.  Neurological: She is alert.  Skin: Skin is warm and dry. She is not diaphoretic.  Psychiatric: Affect normal.    Filed Vitals:   05/22/12 1412  BP: 144/86  Pulse: 83  Temp: 98.3 F (36.8 C)  TempSrc: Oral  Resp: 15  Height: 5' 1.5" (1.562 m)  Weight: 164 lb 6.4 oz (74.571 kg)      Labs reviewed: Basic Metabolic Panel:  Recent Labs  11/91/47 1511  NA 146*  K 5.0  CL 105  CO2 26  GLUCOSE 93  BUN 15  CREATININE 1.03*  CALCIUM 10.1  TSH 1.890   Liver Function Tests:  Recent Labs  04/24/12 1511  AST 18  ALT 13  ALKPHOS 82  BILITOT 0.4  PROT 7.9   No results found for this basename: LIPASE, AMYLASE,  in the last 8760 hours No results found for this basename: AMMONIA,  in the last 8760 hours CBC:  Recent Labs  04/24/12 1511  WBC 7.8  NEUTROABS 4.4  HGB 13.2  HCT 41.9  MCV 86    Assessment/Plan Dementia Has been on aricept 10mg  for several years. Will start namenda XR at this time.   COPD (chronic obstructive pulmonary disease) Stable on current medications does not have rescue inhaler- will add albuterol   CHF (congestive heart failure) Daughter weights mother daily- did not weigh today or take lasix- take lasix 40mg  m, w, f, and Saturday Will get a cardiology consult so pt can establish care with cardiology in [place]  Hypertension Pt has been taking coreg 40mg  ER. Will cont with this Rx     Labs/tests ordered  Will get follow up BMP and fasting lipids

## 2012-05-31 ENCOUNTER — Other Ambulatory Visit: Payer: Medicare Other

## 2012-06-24 ENCOUNTER — Telehealth: Payer: Self-pay | Admitting: *Deleted

## 2012-06-24 ENCOUNTER — Other Ambulatory Visit: Payer: Self-pay | Admitting: *Deleted

## 2012-06-24 MED ORDER — LISINOPRIL 40 MG PO TABS: 40.0000 mg | ORAL_TABLET | Freq: Every day | ORAL | Status: DC

## 2012-06-24 MED ORDER — SIMVASTATIN 20 MG PO TABS: 20.0000 mg | ORAL_TABLET | Freq: Every evening | ORAL | Status: DC

## 2012-06-24 MED ORDER — MEMANTINE HCL ER 28 MG PO CP24: 1.0000 | ORAL_CAPSULE | Freq: Once | ORAL | Status: DC

## 2012-06-24 NOTE — Telephone Encounter (Signed)
Spoke with Dr. Renato Gails and called in Namenda XR 28 once daily. Called into [facility] on [address]

## 2012-06-24 NOTE — Telephone Encounter (Signed)
Patient caregiver called and stated that you placed patient on Namenda XR and now needs a Rx called into [facility] on [address]. Medication is not in medication list, but in your note. Please advise the directions and strength.

## 2012-07-10 ENCOUNTER — Other Ambulatory Visit: Payer: Self-pay | Admitting: Geriatric Medicine

## 2012-07-10 NOTE — Telephone Encounter (Signed)
Insurance is no longer covering the clonidine patch. Will you please prescribe something comparable? Thanks.

## 2012-07-16 DIAGNOSIS — I509 Heart failure, unspecified: Secondary | ICD-10-CM | POA: Diagnosis not present

## 2012-07-16 DIAGNOSIS — I10 Essential (primary) hypertension: Secondary | ICD-10-CM | POA: Diagnosis not present

## 2012-07-16 DIAGNOSIS — E785 Hyperlipidemia, unspecified: Secondary | ICD-10-CM | POA: Diagnosis not present

## 2012-07-16 DIAGNOSIS — I252 Old myocardial infarction: Secondary | ICD-10-CM | POA: Diagnosis not present

## 2012-07-22 DIAGNOSIS — I509 Heart failure, unspecified: Secondary | ICD-10-CM | POA: Diagnosis not present

## 2012-08-21 ENCOUNTER — Ambulatory Visit: Payer: Medicare Other | Admitting: Nurse Practitioner

## 2012-09-05 ENCOUNTER — Other Ambulatory Visit: Payer: Self-pay | Admitting: Geriatric Medicine

## 2012-09-05 MED ORDER — TIOTROPIUM BROMIDE MONOHYDRATE 18 MCG IN CAPS: 18.0000 ug | ORAL_CAPSULE | Freq: Every day | RESPIRATORY_TRACT | Status: DC

## 2012-10-11 ENCOUNTER — Other Ambulatory Visit: Payer: Self-pay | Admitting: *Deleted

## 2012-10-11 MED ORDER — SIMVASTATIN 20 MG PO TABS: 20.0000 mg | ORAL_TABLET | Freq: Every evening | ORAL | Status: DC

## 2012-10-14 DIAGNOSIS — F0390 Unspecified dementia without behavioral disturbance: Secondary | ICD-10-CM | POA: Diagnosis not present

## 2012-10-14 DIAGNOSIS — E785 Hyperlipidemia, unspecified: Secondary | ICD-10-CM | POA: Diagnosis not present

## 2012-10-14 DIAGNOSIS — I10 Essential (primary) hypertension: Secondary | ICD-10-CM | POA: Diagnosis not present

## 2012-10-14 DIAGNOSIS — I5032 Chronic diastolic (congestive) heart failure: Secondary | ICD-10-CM | POA: Diagnosis not present

## 2012-10-16 ENCOUNTER — Ambulatory Visit (INDEPENDENT_AMBULATORY_CARE_PROVIDER_SITE_OTHER): Payer: Medicare Other | Admitting: Nurse Practitioner

## 2012-10-16 ENCOUNTER — Encounter: Payer: Self-pay | Admitting: Nurse Practitioner

## 2012-10-16 VITALS — BP 140/82 | HR 64 | Ht 61.03 in | Wt 154.0 lb

## 2012-10-16 DIAGNOSIS — I10 Essential (primary) hypertension: Secondary | ICD-10-CM

## 2012-10-16 DIAGNOSIS — Z23 Encounter for immunization: Secondary | ICD-10-CM | POA: Diagnosis not present

## 2012-10-16 DIAGNOSIS — E785 Hyperlipidemia, unspecified: Secondary | ICD-10-CM

## 2012-10-16 DIAGNOSIS — R413 Other amnesia: Secondary | ICD-10-CM

## 2012-10-16 DIAGNOSIS — I509 Heart failure, unspecified: Secondary | ICD-10-CM | POA: Diagnosis not present

## 2012-10-16 DIAGNOSIS — R197 Diarrhea, unspecified: Secondary | ICD-10-CM

## 2012-10-16 MED ORDER — LISINOPRIL 40 MG PO TABS: 40.0000 mg | ORAL_TABLET | Freq: Every day | ORAL | Status: DC

## 2012-10-16 MED ORDER — CARVEDILOL PHOSPHATE ER 40 MG PO CP24: 40.0000 mg | ORAL_CAPSULE | Freq: Every day | ORAL | Status: DC

## 2012-10-16 MED ORDER — DONEPEZIL HCL 10 MG PO TABS: 10.0000 mg | ORAL_TABLET | Freq: Every day | ORAL | Status: DC

## 2012-10-16 MED ORDER — MEMANTINE HCL ER 28 MG PO CP24: 1.0000 | ORAL_CAPSULE | Freq: Once | ORAL | Status: DC

## 2012-10-16 NOTE — Progress Notes (Signed)
Patient ID: Carol Monroe, female   DOB: 1940/08/03, 72 y.o.   MRN: [id number]   No Known Allergies  Chief Complaint  Patient presents with  . Form Completion  . Labs Only    fasting for any necessary labs   . Medication Refill    Lisinopril- 90 day supply   . Medication Management    Discuss Aricept refill- only given for 7 days    HPI: Patient is a 72 y.o. female seen in the office today for paperwork to be filled out and to have Aricept and other medication refilled with a 90 day supply. (aricept has been being filled with a 7 day supply) -She has been fasting for labs; pt missed a lab appt  -pts daughter would like paper work filled out for pt to attend an adult daycare program -Pt has been following with cardiology recently stopped lasix and put her on chlorthalidone. Pt with no complaints and daughter has no concerns  Review of Systems:  Review of Systems  Constitutional: Negative for fever and chills.  Respiratory: Negative for cough, sputum production, shortness of breath and wheezing.   Cardiovascular: Negative for chest pain.  Gastrointestinal: Positive for diarrhea (will have occasinal diarrhea). Negative for heartburn, abdominal pain and constipation.  Genitourinary: Negative for dysuria, urgency and frequency.  Musculoskeletal: Negative for myalgias, back pain and joint pain.  Skin: Negative.   Neurological: Negative for dizziness and headaches.  Psychiatric/Behavioral: Positive for memory loss. Negative for depression. The patient is not nervous/anxious and does not have insomnia.      Past Medical History  Diagnosis Date  . CHF (congestive heart failure)   . COPD (chronic obstructive pulmonary disease)   . Stroke   . Hypertension   . Heart attack   . Murmur, heart   . Dementia    Past Surgical History  Procedure Laterality Date  . Abdominal hysterectomy      Partial    Social History:   reports that she quit smoking about 6 years ago. Her smoking use  included Cigarettes. She has a 80 pack-year smoking history. She does not have any smokeless tobacco history on file. She reports that she does not drink alcohol or use illicit drugs.  Family History  Problem Relation Age of Onset  . Heart disease Mother   . Diabetes Mother   . Diabetes Father     Medications: Patient's Medications  New Prescriptions   No medications on file  Previous Medications   ALBUTEROL (PROVENTIL HFA;VENTOLIN HFA) 108 (90 BASE) MCG/ACT INHALER    Inhale 2 puffs into the lungs every 6 (six) hours as needed for wheezing (as needed for shortness of breath and/or wheezing).   CARVEDILOL (COREG CR) 40 MG 24 HR CAPSULE    Take 1 capsule (40 mg total) by mouth daily.   DONEPEZIL (ARICEPT) 10 MG TABLET    Take 1 tablet (10 mg total) by mouth at bedtime.   LISINOPRIL (PRINIVIL,ZESTRIL) 40 MG TABLET    Take 1 tablet (40 mg total) by mouth daily.   MEMANTINE HCL ER (NAMENDA XR) 28 MG CP24    Take 28 mg by mouth once. Daily for memory   POTASSIUM CHLORIDE SA (K-DUR,KLOR-CON) 20 MEQ TABLET    One daily Mon, Wed, Fri, and Sat.   SIMVASTATIN (ZOCOR) 20 MG TABLET    Take 1 tablet (20 mg total) by mouth every evening.   TIOTROPIUM (SPIRIVA HANDIHALER) 18 MCG INHALATION CAPSULE    Place 1 capsule (18 mcg  total) into inhaler and inhale daily.  Modified Medications   No medications on file  Discontinued Medications   CLONIDINE (CATAPRES - DOSED IN MG/24 HR) 0.1 MG/24HR PATCH    Place 1 patch (0.1 mg total) onto the skin once a week.   FUROSEMIDE (LASIX) 40 MG TABLET    One daily on Mon, Wed, Fri, and Sat.     Physical Exam:  Filed Vitals:   10/16/12 1618  BP: 140/82  Pulse: 64  Height: 5' 1.03" (1.55 m)  Weight: 154 lb (69.854 kg)  SpO2: 96%    Physical Exam  Vitals reviewed. Constitutional: She is oriented to person, place, and time and well-developed, well-nourished, and in no distress. No distress.  HENT:  Head: Normocephalic and atraumatic.  Nose: Nose normal.   Mouth/Throat: Oropharynx is clear and moist. No oropharyngeal exudate.  Poor dental hygiene- daughter reports they are planning to see a dentist   Eyes: Conjunctivae are normal. Pupils are equal, round, and reactive to light.  Neck: Normal range of motion. Neck supple. No thyromegaly present.  Cardiovascular: Normal rate, regular rhythm and normal heart sounds.   Pulmonary/Chest: Effort normal and breath sounds normal. No respiratory distress.  Abdominal: Soft. Bowel sounds are normal. She exhibits no distension.  Musculoskeletal: Normal range of motion. She exhibits no edema and no tenderness.  Lymphadenopathy:    She has no cervical adenopathy.  Neurological: She is alert and oriented to person, place, and time.  Skin: Skin is warm and dry. She is not diaphoretic.    Labs reviewed: Basic Metabolic Panel:  Recent Labs  16/10/96 1511  NA 146*  K 5.0  CL 105  CO2 26  GLUCOSE 93  BUN 15  CREATININE 1.03*  CALCIUM 10.1  TSH 1.890   Liver Function Tests:  Recent Labs  04/24/12 1511  AST 18  ALT 13  ALKPHOS 82  BILITOT 0.4  PROT 7.9   No results found for this basename: LIPASE, AMYLASE,  in the last 8760 hours No results found for this basename: AMMONIA,  in the last 8760 hours CBC:  Recent Labs  04/24/12 1511  WBC 7.8  NEUTROABS 4.4  HGB 13.2  HCT 41.9  MCV 86    Assessment/Plan  1. Memory loss - donepezil (ARICEPT) 10 MG tablet; Take 1 tablet (10 mg total) by mouth at bedtime.  Dispense: 90 tablet; Refill: 1 - Memantine HCl ER (NAMENDA XR) 28 MG CP24; Take 28 mg by mouth once. Daily for memory  Dispense: 90 capsule; Refill: 2 -paper work filled out and faxed to adult day care  2. Need for prophylactic vaccination and inoculation against influenza Given flu vaccine  3. Essential hypertension, benign Stable; refills provided - lisinopril (PRINIVIL,ZESTRIL) 40 MG tablet; Take 1 tablet (40 mg total) by mouth daily.  Dispense: 90 tablet; Refill: 1 -  carvedilol (COREG CR) 40 MG 24 hr capsule; Take 1 capsule (40 mg total) by mouth daily.  Dispense: 90 capsule; Refill: 3  4. Other and unspecified hyperlipidemia - Lipid panel  5. CHF (congestive heart failure) Off lasix and has not started chlorthalidone; daughter to pick up Rx  Will follow up bmp; pt still talking potassium  - Basic metabolic panel  6. Diarrhea To take benefiber daily for better bowel regimen   Pt to follow up in 3 months

## 2012-10-16 NOTE — Patient Instructions (Signed)
May take benefiber daily for bowels  Constipation, Adult Constipation is when a person has fewer than 3 bowel movements a week; has difficulty having a bowel movement; or has stools that are dry, hard, or larger than normal. As people grow older, constipation is more common. If you try to fix constipation with medicines that make you have a bowel movement (laxatives), the problem may get worse. Long-term laxative use may cause the muscles of the colon to become weak. A low-fiber diet, not taking in enough fluids, and taking certain medicines may make constipation worse. CAUSES   Certain medicines, such as antidepressants, pain medicine, iron supplements, antacids, and water pills.   Certain diseases, such as diabetes, irritable bowel syndrome (IBS), thyroid disease, or depression.   Not drinking enough water.   Not eating enough fiber-rich foods.   Stress or travel.  Lack of physical activity or exercise.  Not going to the restroom when there is the urge to have a bowel movement.  Ignoring the urge to have a bowel movement.  Using laxatives too much. SYMPTOMS   Having fewer than 3 bowel movements a week.   Straining to have a bowel movement.   Having hard, dry, or larger than normal stools.   Feeling full or bloated.   Pain in the lower abdomen.  Not feeling relief after having a bowel movement. DIAGNOSIS  Your caregiver will take a medical history and perform a physical exam. Further testing may be done for severe constipation. Some tests may include:   A barium enema X-ray to examine your rectum, colon, and sometimes, your small intestine.  A sigmoidoscopy to examine your lower colon.  A colonoscopy to examine your entire colon. TREATMENT  Treatment will depend on the severity of your constipation and what is causing it. Some dietary treatments include drinking more fluids and eating more fiber-rich foods. Lifestyle treatments may include regular exercise. If  these diet and lifestyle recommendations do not help, your caregiver may recommend taking over-the-counter laxative medicines to help you have bowel movements. Prescription medicines may be prescribed if over-the-counter medicines do not work.  HOME CARE INSTRUCTIONS   Increase dietary fiber in your diet, such as fruits, vegetables, whole grains, and beans. Limit high-fat and processed sugars in your diet, such as [place] fries, hamburgers, cookies, candies, and soda.   A fiber supplement may be added to your diet if you cannot get enough fiber from foods.   Drink enough fluids to keep your urine clear or pale yellow.   Exercise regularly or as directed by your caregiver.   Go to the restroom when you have the urge to go. Do not hold it.  Only take medicines as directed by your caregiver. Do not take other medicines for constipation without talking to your caregiver first. SEEK IMMEDIATE MEDICAL CARE IF:   You have bright red blood in your stool.   Your constipation lasts for more than 4 days or gets worse.   You have abdominal or rectal pain.   You have thin, pencil-like stools.  You have unexplained weight loss. MAKE SURE YOU:   Understand these instructions.  Will watch your condition.  Will get help right away if you are not doing well or get worse. Document Released: 10/08/2003 Document Revised: 04/03/2011 Document Reviewed: 12/13/2010 [facility] Patient Information 2014 [place], [place].

## 2012-10-17 LAB — LIPID PANEL
Chol/HDL Ratio: 2.1 ratio units (ref 0.0–4.4)
HDL: 75 mg/dL (ref >39)
Triglycerides: 80 mg/dL (ref 0–149)
VLDL Cholesterol Cal: 16 mg/dL (ref 5–40)

## 2012-10-17 LAB — BASIC METABOLIC PANEL
Calcium: 9.5 mg/dL (ref 8.6–10.2)
Chloride: 103 mmol/L (ref 97–108)
GFR calc Af Amer: 75 mL/min/{1.73_m2} (ref >59)
GFR calc non Af Amer: 65 mL/min/{1.73_m2} (ref >59)
Glucose: 60 mg/dL — ABNORMAL LOW (ref 65–99)
Potassium: 4.1 mmol/L (ref 3.5–5.2)
Sodium: 143 mmol/L (ref 134–144)

## 2012-11-20 DIAGNOSIS — E785 Hyperlipidemia, unspecified: Secondary | ICD-10-CM | POA: Diagnosis not present

## 2012-11-20 DIAGNOSIS — I10 Essential (primary) hypertension: Secondary | ICD-10-CM | POA: Diagnosis not present

## 2012-11-20 DIAGNOSIS — I5032 Chronic diastolic (congestive) heart failure: Secondary | ICD-10-CM | POA: Diagnosis not present

## 2012-12-25 ENCOUNTER — Other Ambulatory Visit: Payer: Self-pay | Admitting: Internal Medicine

## 2013-01-09 ENCOUNTER — Encounter: Payer: Self-pay | Admitting: Nurse Practitioner

## 2013-01-09 ENCOUNTER — Ambulatory Visit (INDEPENDENT_AMBULATORY_CARE_PROVIDER_SITE_OTHER): Payer: Medicare Other | Admitting: Nurse Practitioner

## 2013-01-09 VITALS — BP 132/86 | HR 53 | Temp 97.7°F | Resp 12 | Wt 151.0 lb

## 2013-01-09 DIAGNOSIS — R109 Unspecified abdominal pain: Secondary | ICD-10-CM

## 2013-01-09 MED ORDER — POTASSIUM CHLORIDE CRYS ER 20 MEQ PO TBCR: EXTENDED_RELEASE_TABLET | ORAL | Status: DC

## 2013-01-09 NOTE — Patient Instructions (Signed)
Will get labs today Follow up in 4 weeks with DR Renato Gails

## 2013-01-09 NOTE — Progress Notes (Signed)
Patient ID: Carol Monroe, female   DOB: 11/14/1940, 72 y.o.   MRN: [id number]    No Known Allergies  Chief Complaint  Patient presents with  . Abdominal Pain    Stomach pain off/on since September 2014   . Diarrhea    x few months, seen 09/2012    HPI: Patient is a 72 y.o. female seen in the office today for abdominal pain and diarrhea; recommended bene-fiber daily but this has not been helping Several times a month pt will have abdominal pain with diarrhea last several days and then will resolve on its own.  Episodes are random; Not associated with foods that she eats; no fevers or chills, no shortness of breath or chest discomfort.  Denies acid reflux but reports burning sensation  Daughter here with pt; and provides most of the history Review of Systems:  Review of Systems  Constitutional: Positive for weight loss. Negative for fever, chills and malaise/fatigue.  Respiratory: Negative for shortness of breath.   Cardiovascular: Negative for chest pain, palpitations and leg swelling.  Gastrointestinal: Positive for abdominal pain and diarrhea. Negative for heartburn, nausea, vomiting, constipation and blood in stool.       Not currently having symptoms   Genitourinary: Negative for dysuria.  Musculoskeletal: Negative for myalgias.  Skin: Negative.   Neurological: Negative for dizziness and headaches.  Psychiatric/Behavioral: Positive for memory loss.     Past Medical History  Diagnosis Date  . CHF (congestive heart failure)   . COPD (chronic obstructive pulmonary disease)   . Stroke   . Hypertension   . Heart attack   . Murmur, heart   . Dementia    Past Surgical History  Procedure Laterality Date  . Abdominal hysterectomy      Partial    Social History:   reports that she quit smoking about 6 years ago. Her smoking use included Cigarettes. She has a 80 pack-year smoking history. She does not have any smokeless tobacco history on file. She reports that she does not  drink alcohol or use illicit drugs.  Family History  Problem Relation Age of Onset  . Heart disease Mother   . Diabetes Mother   . Diabetes Father     Medications: Patient's Medications  New Prescriptions   No medications on file  Previous Medications   ALBUTEROL (PROVENTIL HFA;VENTOLIN HFA) 108 (90 BASE) MCG/ACT INHALER    Inhale 2 puffs into the lungs every 6 (six) hours as needed for wheezing (as needed for shortness of breath and/or wheezing).   CARVEDILOL (COREG CR) 40 MG 24 HR CAPSULE    Take 1 capsule (40 mg total) by mouth daily.   CHLORTHALIDONE (HYGROTON) 25 MG TABLET    Take 25 mg by mouth. 1/2 by mouth daily   DONEPEZIL (ARICEPT) 10 MG TABLET    Take 1 tablet (10 mg total) by mouth at bedtime.   LISINOPRIL (PRINIVIL,ZESTRIL) 40 MG TABLET    Take 1 tablet (40 mg total) by mouth daily.   NAMENDA XR 28 MG CP24    TAKE 1 CAPSULE EVERY DAY   POTASSIUM CHLORIDE SA (K-DUR,KLOR-CON) 20 MEQ TABLET    One daily Mon, Wed, Fri, and Sat.   SIMVASTATIN (ZOCOR) 20 MG TABLET    Take 1 tablet (20 mg total) by mouth every evening.   TIOTROPIUM (SPIRIVA HANDIHALER) 18 MCG INHALATION CAPSULE    Place 1 capsule (18 mcg total) into inhaler and inhale daily.  Modified Medications   No medications on file  Discontinued Medications   No medications on file     Physical Exam:  Filed Vitals:   01/09/13 1453  BP: 132/86  Pulse: 53  Temp: 97.7 F (36.5 C)  TempSrc: Oral  Resp: 12  Weight: 151 lb (68.493 kg)  SpO2: 89%    Physical Exam  Vitals reviewed. Constitutional: She is well-developed, well-nourished, and in no distress.  Neck: Normal range of motion. Neck supple. No thyromegaly present.  Cardiovascular: Normal rate, regular rhythm and normal heart sounds.   Pulmonary/Chest: Effort normal and breath sounds normal. No respiratory distress.  Abdominal: Soft. Bowel sounds are normal. She exhibits no distension and no mass. There is no tenderness. There is no rebound and no  guarding.  Musculoskeletal: She exhibits no edema.  Lymphadenopathy:    She has no cervical adenopathy.  Neurological: She is alert.  Skin: Skin is warm and dry. She is not diaphoretic.     Labs reviewed: Basic Metabolic Panel:  Recent Labs  28/41/32 1511 10/16/12 1710  NA 146* 143  K 5.0 4.1  CL 105 103  CO2 26 25  GLUCOSE 93 60*  BUN 15 12  CREATININE 1.03* 0.90  CALCIUM 10.1 9.5  TSH 1.890  --    Liver Function Tests:  Recent Labs  04/24/12 1511  AST 18  ALT 13  ALKPHOS 82  BILITOT 0.4  PROT 7.9   No results found for this basename: LIPASE, AMYLASE,  in the last 8760 hours No results found for this basename: AMMONIA,  in the last 8760 hours CBC:  Recent Labs  04/24/12 1511  WBC 7.8  NEUTROABS 4.4  HGB 13.2  HCT 41.9  MCV 86   Lipid Panel:  Recent Labs  10/16/12 1710  HDL 75  LDLCALC 70  TRIG 80  CHOLHDL 2.1   TSH:  Recent Labs  04/24/12 1511  TSH 1.890    Assessment/Plan 1. Abdominal pain, unspecified site -with diarrhea; unsuccessful on benefiber  And increasing fiber intake -will start with lab work  - Comprehensive metabolic panel - CBC With differential/Platelet - Lipase - Amylase - H. pylori Antibody, IgG to rule out H pylori -increase hydration with diarrhea; bland diet and to advance as tolerated  -educated on when to seek emergency treatment

## 2013-01-11 LAB — CBC WITH DIFFERENTIAL
Basophils Absolute: 0 10*3/uL (ref 0.0–0.2)
Eosinophils Absolute: 0.2 10*3/uL (ref 0.0–0.4)
Immature Grans (Abs): 0 10*3/uL (ref 0.0–0.1)
Immature Granulocytes: 0 %
Lymphs: 37 %
MCHC: 32.7 g/dL (ref 31.5–35.7)
Neutrophils Relative %: 53 %
Platelets: 268 10*3/uL (ref 150–379)
RDW: 16.3 % — ABNORMAL HIGH (ref 12.3–15.4)
WBC: 9.4 10*3/uL (ref 3.4–10.8)

## 2013-01-11 LAB — COMPREHENSIVE METABOLIC PANEL
ALT: 18 IU/L (ref 0–32)
AST: 16 IU/L (ref 0–40)
Albumin/Globulin Ratio: 1.4 (ref 1.1–2.5)
Chloride: 103 mmol/L (ref 97–108)
GFR calc Af Amer: 53 mL/min/{1.73_m2} — ABNORMAL LOW (ref >59)
GFR calc non Af Amer: 46 mL/min/{1.73_m2} — ABNORMAL LOW (ref >59)
Potassium: 5.3 mmol/L — ABNORMAL HIGH (ref 3.5–5.2)
Sodium: 143 mmol/L (ref 134–144)
Total Bilirubin: <0.2 mg/dL (ref 0.0–1.2)

## 2013-01-11 LAB — LIPASE: Lipase: 86 U/L — ABNORMAL HIGH (ref 0–59)

## 2013-01-13 ENCOUNTER — Other Ambulatory Visit: Payer: Self-pay | Admitting: *Deleted

## 2013-01-13 ENCOUNTER — Other Ambulatory Visit: Payer: Self-pay | Admitting: Nurse Practitioner

## 2013-01-13 MED ORDER — BIS SUBCIT-METRONID-TETRACYC 140-125-125 MG PO CAPS: 3.0000 | ORAL_CAPSULE | Freq: Three times a day (TID) | ORAL | Status: DC

## 2013-01-14 NOTE — Progress Notes (Signed)
Pt daughter notified VIA phone and will f/u in 2 weeks.

## 2013-01-24 ENCOUNTER — Other Ambulatory Visit: Payer: Self-pay | Admitting: *Deleted

## 2013-01-24 MED ORDER — BIS SUBCIT-METRONID-TETRACYC 140-125-125 MG PO CAPS: 3.0000 | ORAL_CAPSULE | Freq: Three times a day (TID) | ORAL | Status: DC

## 2013-01-29 ENCOUNTER — Telehealth: Payer: Self-pay | Admitting: *Deleted

## 2013-01-29 NOTE — Telephone Encounter (Signed)
[name] called and stated that patients Carol Monroe medication is not being paid for through insurance and needs something they will cover. Please Advise.

## 2013-01-30 ENCOUNTER — Other Ambulatory Visit: Payer: Self-pay | Admitting: Nurse Practitioner

## 2013-01-30 MED ORDER — BISMUTH SUBSALICYLATE 262 MG PO TABS: ORAL_TABLET | ORAL | Status: DC

## 2013-01-30 MED ORDER — METRONIDAZOLE 500 MG PO TABS: 500.0000 mg | ORAL_TABLET | Freq: Two times a day (BID) | ORAL | Status: DC

## 2013-01-30 MED ORDER — TETRACYCLINE HCL 500 MG PO CAPS: 500.0000 mg | ORAL_CAPSULE | Freq: Two times a day (BID) | ORAL | Status: DC

## 2013-01-30 NOTE — Telephone Encounter (Signed)
Daughter, [name], calling again today regarding patient's medication

## 2013-01-30 NOTE — Telephone Encounter (Signed)
Jessica faxed in another medication to patient's pharmacy. Called and left message on Donna's voicemail that medication was faxed to pharmacy and any questions to call.

## 2013-01-31 ENCOUNTER — Telehealth: Payer: Self-pay | Admitting: *Deleted

## 2013-01-31 NOTE — Telephone Encounter (Signed)
Spoke to Pharmacist and Tetracycline is no longer made.  What he suggests trying is the Prevpak.  The cost would not be tha much for her if it would work. Please advise. thanks

## 2013-01-31 NOTE — Telephone Encounter (Signed)
Per [name] the Prevpak will be okay for pt to take.  Called Pharmacy back and the medication is being filled. Pt's daughter notified.  Aware/agreeable

## 2013-02-03 MED ORDER — AMOXICILL-CLARITHRO-LANSOPRAZ PO MISC: 1.0000 | Freq: Two times a day (BID) | ORAL | Status: DC

## 2013-02-03 NOTE — Addendum Note (Signed)
Addended by: Logan Bores on: 02/03/2013 01:15 PM   Modules accepted: Orders

## 2013-02-03 NOTE — Telephone Encounter (Signed)
Per Armandina Gemma rx was called in and order was verbally given to pharmacist

## 2013-02-03 NOTE — Telephone Encounter (Signed)
Denise after I spoke with you did this get taken care of?

## 2013-03-03 ENCOUNTER — Other Ambulatory Visit: Payer: Self-pay | Admitting: Nurse Practitioner

## 2013-03-27 ENCOUNTER — Encounter: Payer: Self-pay | Admitting: Nurse Practitioner

## 2013-03-27 ENCOUNTER — Ambulatory Visit (INDEPENDENT_AMBULATORY_CARE_PROVIDER_SITE_OTHER): Payer: Medicare Other | Admitting: Nurse Practitioner

## 2013-03-27 VITALS — BP 146/82 | HR 64 | Temp 97.7°F | Resp 16 | Wt 150.4 lb

## 2013-03-27 DIAGNOSIS — R197 Diarrhea, unspecified: Secondary | ICD-10-CM

## 2013-03-27 DIAGNOSIS — J449 Chronic obstructive pulmonary disease, unspecified: Secondary | ICD-10-CM

## 2013-03-27 DIAGNOSIS — I509 Heart failure, unspecified: Secondary | ICD-10-CM

## 2013-03-27 DIAGNOSIS — J4489 Other specified chronic obstructive pulmonary disease: Secondary | ICD-10-CM

## 2013-03-27 DIAGNOSIS — F0390 Unspecified dementia without behavioral disturbance: Secondary | ICD-10-CM

## 2013-03-27 DIAGNOSIS — Z23 Encounter for immunization: Secondary | ICD-10-CM

## 2013-03-27 DIAGNOSIS — R413 Other amnesia: Secondary | ICD-10-CM

## 2013-03-27 DIAGNOSIS — I10 Essential (primary) hypertension: Secondary | ICD-10-CM

## 2013-03-27 MED ORDER — DONEPEZIL HCL 10 MG PO TABS: 10.0000 mg | ORAL_TABLET | Freq: Every day | ORAL | Status: DC

## 2013-03-27 MED ORDER — LISINOPRIL 40 MG PO TABS: 40.0000 mg | ORAL_TABLET | Freq: Every day | ORAL | Status: DC

## 2013-03-27 MED ORDER — TETANUS-DIPHTH-ACELL PERTUSSIS 5-2.5-18.5 LF-MCG/0.5 IM SUSP: 0.5000 mL | Freq: Once | INTRAMUSCULAR | Status: DC

## 2013-03-27 MED ORDER — MEMANTINE HCL ER 28 MG PO CP24: ORAL_CAPSULE | ORAL | Status: DC

## 2013-03-27 MED ORDER — ZOSTER VACCINE LIVE 19400 UNT/0.65ML ~~LOC~~ SOLR: 0.6500 mL | Freq: Once | SUBCUTANEOUS | Status: DC

## 2013-03-27 MED ORDER — SIMVASTATIN 20 MG PO TABS: ORAL_TABLET | ORAL | Status: DC

## 2013-03-27 NOTE — Patient Instructions (Addendum)
Increase metamucil to 3 times daily Will try creon- take 1 tablet with meals  Follow up with Dr Mariea Clonts in 2 weeks   Also, blood work today and bring stool sample back to office for stool test to be done.

## 2013-03-27 NOTE — Progress Notes (Signed)
Patient ID: Carol Monroe, female   DOB: [birth year], 73 y.o.   MRN: [id number]    No Known Allergies  Chief Complaint  Patient presents with  . Follow-up    f/u  . other    still having loose BM  . Immunizations    needs Pneumo vaccine, & Tdap/shingle to be printed.    HPI: Patient is a 73 y.o. female with pmh of CHF, COPD, HTN, CVA, Dementia seen in the office today for routine follow; recently dx and treated for h pylori still having diarrhea; completed treatment for H pylori which helped the diarrhea but is has returned; bene-fiber did not help  Does not eat a lot of diary, thinks this was causing diarrhea so will not eat diary but then still has diarrhea; having an episode every day.  No antibiotic use other than what was prescribed for h pylori  Review of Systems:  Review of Systems  Constitutional: Positive for weight loss. Negative for fever, chills and malaise/fatigue.  Respiratory: Negative for shortness of breath.   Cardiovascular: Negative for chest pain, palpitations and leg swelling.  Gastrointestinal: Positive for diarrhea. Negative for heartburn, nausea, vomiting, abdominal pain, constipation and blood in stool.  Genitourinary: Negative for dysuria, urgency and frequency.  Musculoskeletal: Negative for myalgias.  Skin: Negative.   Neurological: Negative for dizziness and headaches.  Psychiatric/Behavioral: Positive for memory loss. Negative for depression. The patient is not nervous/anxious and does not have insomnia.      Past Medical History  Diagnosis Date  . CHF (congestive heart failure)   . COPD (chronic obstructive pulmonary disease)   . Stroke   . Hypertension   . Heart attack   . Murmur, heart   . Dementia    Past Surgical History  Procedure Laterality Date  . Abdominal hysterectomy      Partial    Social History:   reports that she quit smoking about 7 years ago. Her smoking use included Cigarettes. She has a 80 pack-year smoking history. She  does not have any smokeless tobacco history on file. She reports that she does not drink alcohol or use illicit drugs.  Family History  Problem Relation Age of Onset  . Heart disease Mother   . Diabetes Mother   . Diabetes Father     Medications: Patient's Medications  New Prescriptions   No medications on file  Previous Medications   ALBUTEROL (PROVENTIL HFA;VENTOLIN HFA) 108 (90 BASE) MCG/ACT INHALER    Inhale 2 puffs into the lungs every 6 (six) hours as needed for wheezing (as needed for shortness of breath and/or wheezing).   CARVEDILOL (COREG CR) 40 MG 24 HR CAPSULE    Take 1 capsule (40 mg total) by mouth daily.   CHLORTHALIDONE (HYGROTON) 25 MG TABLET    Take 25 mg by mouth. 1/2 by mouth daily   DONEPEZIL (ARICEPT) 10 MG TABLET    Take 1 tablet (10 mg total) by mouth at bedtime.   LISINOPRIL (PRINIVIL,ZESTRIL) 40 MG TABLET    Take 1 tablet (40 mg total) by mouth daily.   NAMENDA XR 28 MG CP24    TAKE 1 CAPSULE EVERY DAY   POTASSIUM CHLORIDE SA (K-DUR,KLOR-CON) 20 MEQ TABLET    One daily Mon, Wed, Fri, and Sat.   SIMVASTATIN (ZOCOR) 20 MG TABLET    TAKE ONE TABLET BY MOUTH IN THE EVENING   TDAP (BOOSTRIX) 5-2.5-18.5 LF-MCG/0.5 INJECTION    Inject 0.5 mLs into the muscle once.   TIOTROPIUM (SPIRIVA  HANDIHALER) 18 MCG INHALATION CAPSULE    Place 1 capsule (18 mcg total) into inhaler and inhale daily.   ZOSTER VACCINE LIVE, PF, (ZOSTAVAX) 21308 UNT/0.65ML INJECTION    Inject 0.65 mLs into the skin once.  Modified Medications   No medications on file  Discontinued Medications   AMOXICILLIN-CLARITHROMYCIN-LANSOPRAZOLE (PREVPAC) COMBO PACK    Take 1 each by mouth 2 (two) times daily. Take as directed per package directions.   BISMUTH SUBSALICYLATE 657 MG TABS    1 tablet twice daily for 14 days   METRONIDAZOLE (FLAGYL) 500 MG TABLET    Take 1 tablet (500 mg total) by mouth 2 (two) times daily. For 14 days   TETRACYCLINE (ACHROMYCIN,SUMYCIN) 500 MG CAPSULE    Take 1 capsule (500 mg  total) by mouth 2 (two) times daily. Give 1 hour before a meal or 2 hour after meals for 14 days     Physical Exam:  Filed Vitals:   03/27/13 1550  BP: 146/82  Pulse: 64  Temp: 97.7 F (36.5 C)  TempSrc: Oral  Resp: 16  Weight: 150 lb 6.4 oz (68.221 kg)  SpO2: 99%    Physical Exam  Vitals reviewed. Constitutional: She is well-developed, well-nourished, and in no distress.  Neck: Normal range of motion. Neck supple. No thyromegaly present.  Cardiovascular: Normal rate, regular rhythm and normal heart sounds.   Pulmonary/Chest: Effort normal and breath sounds normal. No respiratory distress.  Abdominal: Soft. Bowel sounds are normal. She exhibits no distension and no mass. There is no tenderness. There is no rebound and no guarding.  Musculoskeletal: She exhibits no edema.  Lymphadenopathy:    She has no cervical adenopathy.  Neurological: She is alert.  Skin: Skin is warm and dry. She is not diaphoretic.  Psychiatric: Affect normal.     Labs reviewed: Basic Metabolic Panel:  Recent Labs  04/24/12 1511 10/16/12 1710 01/09/13 1549  NA 146* 143 143  K 5.0 4.1 5.3*  CL 105 103 103  CO2 26 25 27   GLUCOSE 93 60* 78  BUN 15 12 17   CREATININE 1.03* 0.90 1.20*  CALCIUM 10.1 9.5 9.6  TSH 1.890  --   --    Liver Function Tests:  Recent Labs  04/24/12 1511 01/09/13 1549  AST 18 16  ALT 13 18  ALKPHOS 82 88  BILITOT 0.4 <0.2  PROT 7.9 7.1    Recent Labs  01/09/13 1549  LIPASE 86*  AMYLASE 150*   No results found for this basename: AMMONIA,  in the last 8760 hours CBC:  Recent Labs  04/24/12 1511 01/09/13 1549  WBC 7.8 9.4  NEUTROABS 4.4 5.0  HGB 13.2 12.6  HCT 41.9 38.5  MCV 86 84  PLT  --  268   Lipid Panel:  Recent Labs  10/16/12 1710  HDL 75  LDLCALC 70  TRIG 80  CHOLHDL 2.1   TSH:  Recent Labs  04/24/12 1511  TSH 1.890   A1C: No components found with this basename: A1C,    Assessment/Plan 1. Need for prophylactic  vaccination with combined diphtheria-tetanus-pertussis (DTP) vaccine - zoster vaccine live, PF, (ZOSTAVAX) 84696 UNT/0.65ML injection; Inject 19,400 Units into the skin once.  Dispense: 1 each; Refill: 0 - Tdap (BOOSTRIX) 5-2.5-18.5 LF-MCG/0.5 injection; Inject 0.5 mLs into the muscle once.  Dispense: 0.5 mL; Refill: 0  2. Need for prophylactic vaccination and inoculation against other viral diseases(V04.89) - zoster vaccine live, PF, (ZOSTAVAX) 29528 UNT/0.65ML injection; Inject 19,400 Units into the skin once.  Dispense: 1 each; Refill: 0 - Tdap (BOOSTRIX) 5-2.5-18.5 LF-MCG/0.5 injection; Inject 0.5 mLs into the muscle once.  Dispense: 0.5 mL; Refill: 0  3. Dementia - donepezil (ARICEPT) 10 MG tablet; Take 1 tablet (10 mg total) by mouth at bedtime.  Dispense: 90 tablet; Refill: 1 -conts namenda XR -FMLA paperwork filled out for pts daughter   4. COPD (chronic obstructive pulmonary disease) - unchanged, no recent exerbation -conts spiriva daily - uses albuterol once per week  5. CHF (congestive heart failure) -stable, conts coreg, chlorthalidone and potassium supplement   6. Hypertension -controlled on current medicaiton  7. Diarrhea -did improve after h pylori treatment but now is worse - Stool culture - Clostridium difficile EIA - Ova and parasite examination - Comprehensive metabolic panel - CBC With differential/Platelet -will have pt increase benefiber to 3 times daily -will start creon 36000/114000/180000 3 times daily with meals   Follow up on diarrhea in 2 weeks with Mariea Clonts

## 2013-03-28 LAB — CBC WITH DIFFERENTIAL
BASOS ABS: 0 10*3/uL (ref 0.0–0.2)
Basos: 0 %
Eos: 3 %
Eosinophils Absolute: 0.2 10*3/uL (ref 0.0–0.4)
HEMATOCRIT: 39.7 % (ref 34.0–46.6)
Hemoglobin: 12.5 g/dL (ref 11.1–15.9)
IMMATURE GRANULOCYTES: 0 %
Immature Grans (Abs): 0 10*3/uL (ref 0.0–0.1)
Lymphocytes Absolute: 3.3 10*3/uL — ABNORMAL HIGH (ref 0.7–3.1)
Lymphs: 38 %
MCH: 26.6 pg (ref 26.6–33.0)
MCHC: 31.5 g/dL (ref 31.5–35.7)
MCV: 85 fL (ref 79–97)
MONOCYTES: 8 %
Monocytes Absolute: 0.7 10*3/uL (ref 0.1–0.9)
NEUTROS ABS: 4.4 10*3/uL (ref 1.4–7.0)
Neutrophils Relative %: 51 %
PLATELETS: 280 10*3/uL (ref 150–379)
RBC: 4.7 x10E6/uL (ref 3.77–5.28)
RDW: 16.4 % — AB (ref 12.3–15.4)
WBC: 8.6 10*3/uL (ref 3.4–10.8)

## 2013-03-28 LAB — COMPREHENSIVE METABOLIC PANEL
ALT: 13 IU/L (ref 0–32)
AST: 16 IU/L (ref 0–40)
Albumin/Globulin Ratio: 1.4 (ref 1.1–2.5)
Albumin: 4.3 g/dL (ref 3.5–4.8)
Alkaline Phosphatase: 76 IU/L (ref 39–117)
BILIRUBIN TOTAL: 0.2 mg/dL (ref 0.0–1.2)
BUN/Creatinine Ratio: 21 (ref 11–26)
BUN: 18 mg/dL (ref 8–27)
CHLORIDE: 101 mmol/L (ref 97–108)
CO2: 27 mmol/L (ref 18–29)
Calcium: 9.7 mg/dL (ref 8.7–10.3)
Creatinine, Ser: 0.84 mg/dL (ref 0.57–1.00)
GFR calc non Af Amer: 70 mL/min/{1.73_m2} (ref >59)
GFR, EST AFRICAN AMERICAN: 81 mL/min/{1.73_m2} (ref >59)
GLUCOSE: 114 mg/dL — AB (ref 65–99)
Globulin, Total: 3 g/dL (ref 1.5–4.5)
POTASSIUM: 4.4 mmol/L (ref 3.5–5.2)
Sodium: 142 mmol/L (ref 134–144)
TOTAL PROTEIN: 7.3 g/dL (ref 6.0–8.5)

## 2013-04-02 ENCOUNTER — Encounter: Payer: Self-pay | Admitting: Nurse Practitioner

## 2013-04-02 NOTE — Progress Notes (Signed)
Please follow up on these stool samples to see if the daughter is going to bring these in

## 2013-04-10 ENCOUNTER — Encounter: Payer: Self-pay | Admitting: Internal Medicine

## 2013-04-10 ENCOUNTER — Ambulatory Visit (INDEPENDENT_AMBULATORY_CARE_PROVIDER_SITE_OTHER): Payer: Medicare Other | Admitting: Internal Medicine

## 2013-04-10 VITALS — BP 118/68 | HR 81 | Temp 97.9°F | Wt 146.8 lb

## 2013-04-10 DIAGNOSIS — I509 Heart failure, unspecified: Secondary | ICD-10-CM

## 2013-04-10 DIAGNOSIS — K8689 Other specified diseases of pancreas: Secondary | ICD-10-CM | POA: Diagnosis not present

## 2013-04-10 DIAGNOSIS — K8681 Exocrine pancreatic insufficiency: Secondary | ICD-10-CM

## 2013-04-10 DIAGNOSIS — J4489 Other specified chronic obstructive pulmonary disease: Secondary | ICD-10-CM | POA: Diagnosis not present

## 2013-04-10 DIAGNOSIS — F0151 Vascular dementia with behavioral disturbance: Secondary | ICD-10-CM

## 2013-04-10 DIAGNOSIS — Z23 Encounter for immunization: Secondary | ICD-10-CM | POA: Diagnosis not present

## 2013-04-10 DIAGNOSIS — F0150 Vascular dementia without behavioral disturbance: Secondary | ICD-10-CM

## 2013-04-10 DIAGNOSIS — I10 Essential (primary) hypertension: Secondary | ICD-10-CM

## 2013-04-10 DIAGNOSIS — F01518 Vascular dementia, unspecified severity, with other behavioral disturbance: Secondary | ICD-10-CM

## 2013-04-10 DIAGNOSIS — J449 Chronic obstructive pulmonary disease, unspecified: Secondary | ICD-10-CM

## 2013-04-10 MED ORDER — PANCRELIPASE (LIP-PROT-AMYL) 24000-76000 UNITS PO CPEP: 24000.0000 [IU] | ORAL_CAPSULE | Freq: Three times a day (TID) | ORAL | Status: DC

## 2013-04-10 NOTE — Progress Notes (Signed)
Patient ID: Carol Monroe, female   DOB: 06-05-1940, 73 y.o.   MRN: [id number]   Location:  [facility] / [place]  No Known Allergies  Chief Complaint  Patient presents with  . Follow-up    2 week f/u diarrhea    HPI: Patient is a 73 y.o. female patient of [name] NP's seen in the office today for f/u on diarrhea.  Was put on creon with meals.  Has helped so much so that pt now feels constipated.  She is on the 36000 capsules right now with meals.  Had a bm 2 days ago.  Goes to day program and had the bm there so no samples obtained.  Could not go at home.    Continues aricept and namenda for memory.  Pt feels memory is good.  Daughter says no.  Has been pretty stable.  Must be reminded of day program.  Reminded to get dressed, take meds.  Gets herself bathed and dressed with prompts to do it.  Lives with her daughter.  Sleeps well.  No falls.  Asked about mood and made a face.  Is moody per daughter.  Today is a decent day.  Feels grumpy on the bad days.  Appetite is good.  Weight had declined a little, but still to where it is good.    Breathing well.  No leg swelling.    Review of Systems:  Review of Systems  Constitutional: Positive for weight loss. Negative for fever, chills and malaise/fatigue.  HENT: Negative for hearing loss.   Eyes: Negative for blurred vision.  Respiratory: Negative for shortness of breath.   Cardiovascular: Negative for chest pain, palpitations and leg swelling.  Gastrointestinal: Positive for constipation. Negative for heartburn, nausea, vomiting, abdominal pain, diarrhea, blood in stool and melena.  Genitourinary: Negative for dysuria, urgency and frequency.       No incontinence  Musculoskeletal: Negative for falls.  Skin: Negative for rash.  Neurological: Negative for dizziness, loss of consciousness, weakness and headaches.  Endo/Heme/Allergies: Does not bruise/bleed easily.  Psychiatric/Behavioral: Positive for memory  loss. Negative for depression.       Carol Monroe     Past Medical History  Diagnosis Date  . CHF (congestive heart failure)   . COPD (chronic obstructive pulmonary disease)   . Stroke   . Hypertension   . Heart attack   . Murmur, heart   . Dementia     Past Surgical History  Procedure Laterality Date  . Abdominal hysterectomy      Partial     Social History:   reports that she quit smoking about 7 years ago. Her smoking use included Cigarettes. She has a 80 pack-year smoking history. She does not have any smokeless tobacco history on file. She reports that she does not drink alcohol or use illicit drugs.  Family History  Problem Relation Age of Onset  . Heart disease Mother   . Diabetes Mother   . Diabetes Father     Medications: Patient's Medications  New Prescriptions   No medications on file  Previous Medications   ALBUTEROL (PROVENTIL HFA;VENTOLIN HFA) 108 (90 BASE) MCG/ACT INHALER    Inhale 2 puffs into the lungs every 6 (six) hours as needed for wheezing (as needed for shortness of breath and/or wheezing).   CARVEDILOL (COREG CR) 40 MG 24 HR CAPSULE    Take 1 capsule (40 mg total) by mouth daily.   CHLORTHALIDONE (HYGROTON) 25 MG TABLET    Take  25 mg by mouth. 1/2 by mouth daily   DONEPEZIL (ARICEPT) 10 MG TABLET    Take 1 tablet (10 mg total) by mouth at bedtime.   LISINOPRIL (PRINIVIL,ZESTRIL) 40 MG TABLET    Take 1 tablet (40 mg total) by mouth daily.   MEMANTINE HCL ER (NAMENDA XR) 28 MG CP24    TAKE 1 CAPSULE EVERY DAY   POTASSIUM CHLORIDE SA (K-DUR,KLOR-CON) 20 MEQ TABLET    One daily Mon, Wed, Fri, and Sat.   SIMVASTATIN (ZOCOR) 20 MG TABLET    TAKE ONE TABLET BY MOUTH IN THE EVENING   TDAP (BOOSTRIX) 5-2.5-18.5 LF-MCG/0.5 INJECTION    Inject 0.5 mLs into the muscle once.   TIOTROPIUM (SPIRIVA HANDIHALER) 18 MCG INHALATION CAPSULE    Place 1 capsule (18 mcg total) into inhaler and inhale daily.   ZOSTER VACCINE LIVE, PF, (ZOSTAVAX) 00712 UNT/0.65ML INJECTION     Inject 19,400 Units into the skin once.  Modified Medications   No medications on file  Discontinued Medications   No medications on file     Physical Exam: Filed Vitals:   04/10/13 1509  BP: 118/68  Pulse: 81  Temp: 97.9 F (36.6 C)  TempSrc: Oral  Weight: 146 lb 12.8 oz (66.588 kg)  SpO2: 96%  Physical Exam  Constitutional: She appears well-developed and well-nourished. No distress.  Cardiovascular: Normal rate, regular rhythm, normal heart sounds and intact distal pulses.   Pulmonary/Chest: Effort normal and breath sounds normal. No respiratory distress.  Abdominal: Soft. Bowel sounds are normal. She exhibits no distension and no mass. There is no tenderness.  Musculoskeletal: Normal range of motion. She exhibits no edema and no tenderness.  Neurological: She is alert.  Oriented to person and place only  Skin: Skin is warm and dry.  Psychiatric: She has a normal mood and affect.     Labs reviewed: Basic Metabolic Panel:  Recent Labs  04/24/12 1511 10/16/12 1710 01/09/13 1549 03/27/13 1652  NA 146* 143 143 142  K 5.0 4.1 5.3* 4.4  CL 105 103 103 101  CO2 26 25 27 27   GLUCOSE 93 60* 78 114*  BUN 15 12 17 18   CREATININE 1.03* 0.90 1.20* 0.84  CALCIUM 10.1 9.5 9.6 9.7  TSH 1.890  --   --   --    Liver Function Tests:  Recent Labs  04/24/12 1511 01/09/13 1549 03/27/13 1652  AST 18 16 16   ALT 13 18 13   ALKPHOS 82 88 76  BILITOT 0.4 <0.2 0.2  PROT 7.9 7.1 7.3    Recent Labs  01/09/13 1549  LIPASE 86*  AMYLASE 150*   No results found for this basename: AMMONIA,  in the last 8760 hours CBC:  Recent Labs  04/24/12 1511 01/09/13 1549 03/27/13 1652  WBC 7.8 9.4 8.6  NEUTROABS 4.4 5.0 4.4  HGB 13.2 12.6 12.5  HCT 41.9 38.5 39.7  MCV 86 84 85  PLT  --  268 280   Lipid Panel:  Recent Labs  10/16/12 1710  HDL 75  LDLCALC 70  TRIG 80  CHOLHDL 2.1   Assessment/Plan 1. Exocrine pancreatic insufficiency -creon is working, but 36000 units  with meals is too much as she is now getting constipated -will reduce dose to 24000 units with meals - Pancrelipase, Lip-Prot-Amyl, 24000 UNITS CPEP; Take 1 capsule (24,000 Units total) by mouth 3 (three) times daily with meals. For diarrhea  Dispense: 90 capsule; Refill: 3  2. Vascular dementia with behavioral disturbance -gradually progressive,  but daughter notes recent stability with aricept and namenda XR  3. COPD (chronic obstructive pulmonary disease) -stable with current inhalers  4. CHF (congestive heart failure) -no signs of acute excerbation  5. Essential hypertension, benign -at goal with current therapy, no changes  6.  Need for vaccination with 13-polyvalent pneumococcal conjugate vaccine -prevnar today  Labs/tests ordered:  None today Next appt:  3 mos with [name]

## 2013-07-10 ENCOUNTER — Encounter: Payer: Self-pay | Admitting: Nurse Practitioner

## 2013-07-10 ENCOUNTER — Ambulatory Visit (INDEPENDENT_AMBULATORY_CARE_PROVIDER_SITE_OTHER): Payer: Medicare Other | Admitting: Nurse Practitioner

## 2013-07-10 VITALS — BP 130/84 | HR 76 | Temp 97.9°F | Wt 147.0 lb

## 2013-07-10 DIAGNOSIS — E785 Hyperlipidemia, unspecified: Secondary | ICD-10-CM | POA: Insufficient documentation

## 2013-07-10 DIAGNOSIS — I10 Essential (primary) hypertension: Secondary | ICD-10-CM | POA: Diagnosis not present

## 2013-07-10 DIAGNOSIS — Z1211 Encounter for screening for malignant neoplasm of colon: Secondary | ICD-10-CM

## 2013-07-10 DIAGNOSIS — J449 Chronic obstructive pulmonary disease, unspecified: Secondary | ICD-10-CM

## 2013-07-10 DIAGNOSIS — F0151 Vascular dementia with behavioral disturbance: Secondary | ICD-10-CM

## 2013-07-10 DIAGNOSIS — F0150 Vascular dementia without behavioral disturbance: Secondary | ICD-10-CM | POA: Diagnosis not present

## 2013-07-10 DIAGNOSIS — K8681 Exocrine pancreatic insufficiency: Secondary | ICD-10-CM

## 2013-07-10 DIAGNOSIS — K8689 Other specified diseases of pancreas: Secondary | ICD-10-CM

## 2013-07-10 DIAGNOSIS — F01518 Vascular dementia, unspecified severity, with other behavioral disturbance: Secondary | ICD-10-CM

## 2013-07-10 NOTE — Progress Notes (Signed)
Patient ID: Carol Monroe, female   DOB: [birth year], 73 y.o.   MRN: [id number]    No Known Allergies  Chief Complaint  Patient presents with  . Medical Management of Chronic Issues    3 month follow-up   . Results    Discuss genetic test results   . Referral    Order colonoscopy    HPI: Patient is a 73 y.o. female seen in the office today for routine follow up Discussed genetic test results- medication doing well  Doing well on creon- occasional episodes of diarrhea but overall much better Needs physical on next appt for insurance  Would like referral for screening colonoscopy  Review of Systems:  Review of Systems  Constitutional: Positive for weight loss. Negative for fever, chills and malaise/fatigue.  HENT: Negative for hearing loss.   Eyes: Negative for blurred vision.  Respiratory: Negative for shortness of breath.   Cardiovascular: Negative for chest pain, palpitations and leg swelling.  Gastrointestinal: Negative for heartburn, nausea, vomiting, abdominal pain, diarrhea, constipation, blood in stool and melena.  Genitourinary: Negative for dysuria, urgency and frequency.       No incontinence  Musculoskeletal: Negative for falls.  Skin: Negative for rash.  Neurological: Negative for dizziness, loss of consciousness, weakness and headaches.  Endo/Heme/Allergies: Does not bruise/bleed easily.  Psychiatric/Behavioral: Positive for memory loss. Negative for depression.     Past Medical History  Diagnosis Date  . CHF (congestive heart failure)   . COPD (chronic obstructive pulmonary disease)   . Stroke   . Hypertension   . Heart attack   . Murmur, heart   . Dementia    Past Surgical History  Procedure Laterality Date  . Abdominal hysterectomy      Partial    Social History:   reports that she quit smoking about 7 years ago. Her smoking use included Cigarettes. She has a 80 pack-year smoking history. She does not have any smokeless tobacco history on file. She  reports that she does not drink alcohol or use illicit drugs.  Family History  Problem Relation Age of Onset  . Heart disease Mother   . Diabetes Mother   . Diabetes Father     Medications: Patient's Medications  New Prescriptions   No medications on file  Previous Medications   ALBUTEROL (PROVENTIL HFA;VENTOLIN HFA) 108 (90 BASE) MCG/ACT INHALER    Inhale 2 puffs into the lungs every 6 (six) hours as needed for wheezing (as needed for shortness of breath and/or wheezing).   CARVEDILOL (COREG CR) 40 MG 24 HR CAPSULE    Take 1 capsule (40 mg total) by mouth daily.   CHLORTHALIDONE (HYGROTON) 25 MG TABLET    Take 25 mg by mouth. 1/2 by mouth daily   DONEPEZIL (ARICEPT) 10 MG TABLET    Take 1 tablet (10 mg total) by mouth at bedtime.   LISINOPRIL (PRINIVIL,ZESTRIL) 40 MG TABLET    Take 1 tablet (40 mg total) by mouth daily.   MEMANTINE HCL ER (NAMENDA XR) 28 MG CP24    TAKE 1 CAPSULE EVERY DAY   PANCRELIPASE, LIP-PROT-AMYL, 24000 UNITS CPEP    Take 1 capsule (24,000 Units total) by mouth 3 (three) times daily with meals. For diarrhea   POTASSIUM CHLORIDE SA (K-DUR,KLOR-CON) 20 MEQ TABLET    One daily Mon, Wed, Fri, and Sat.   SIMVASTATIN (ZOCOR) 20 MG TABLET    TAKE ONE TABLET BY MOUTH IN THE EVENING   TIOTROPIUM (SPIRIVA HANDIHALER) 18 MCG INHALATION CAPSULE  Place 1 capsule (18 mcg total) into inhaler and inhale daily.  Modified Medications   No medications on file  Discontinued Medications   TDAP (BOOSTRIX) 5-2.5-18.5 LF-MCG/0.5 INJECTION    Inject 0.5 mLs into the muscle once.   ZOSTER VACCINE LIVE, PF, (ZOSTAVAX) 46503 UNT/0.65ML INJECTION    Inject 19,400 Units into the skin once.     Physical Exam:  Filed Vitals:   07/10/13 1545  BP: 130/84  Pulse: 76  Temp: 97.9 F (36.6 C)  TempSrc: Oral  Weight: 147 lb (66.679 kg)  SpO2: 94%    Physical Exam  Vitals reviewed. Constitutional: She is well-developed, well-nourished, and in no distress.  HENT:  Mouth/Throat:  Oropharynx is clear and moist. No oropharyngeal exudate.  Eyes: Conjunctivae and EOM are normal. Pupils are equal, round, and reactive to light.  Neck: Normal range of motion. Neck supple. No thyromegaly present.  Cardiovascular: Normal rate, regular rhythm and normal heart sounds.   Pulmonary/Chest: Effort normal and breath sounds normal. No respiratory distress.  Abdominal: Soft. Bowel sounds are normal. She exhibits no distension and no mass. There is no tenderness. There is no rebound and no guarding.  Musculoskeletal: She exhibits no edema.  Lymphadenopathy:    She has no cervical adenopathy.  Neurological: She is alert.  Skin: Skin is warm and dry. She is not diaphoretic.  Psychiatric: Affect normal.    Labs reviewed: Basic Metabolic Panel:  Recent Labs  10/16/12 1710 01/09/13 1549 03/27/13 1652  NA 143 143 142  K 4.1 5.3* 4.4  CL 103 103 101  CO2 25 27 27   GLUCOSE 60* 78 114*  BUN 12 17 18   CREATININE 0.90 1.20* 0.84  CALCIUM 9.5 9.6 9.7   Liver Function Tests:  Recent Labs  01/09/13 1549 03/27/13 1652  AST 16 16  ALT 18 13  ALKPHOS 88 76  BILITOT <0.2 0.2  PROT 7.1 7.3    Recent Labs  01/09/13 1549  LIPASE 86*  AMYLASE 150*   No results found for this basename: AMMONIA,  in the last 8760 hours CBC:  Recent Labs  01/09/13 1549 03/27/13 1652  WBC 9.4 8.6  NEUTROABS 5.0 4.4  HGB 12.6 12.5  HCT 38.5 39.7  MCV 84 85  PLT 268 280   Lipid Panel:  Recent Labs  10/16/12 1710  HDL 75  LDLCALC 70  TRIG 80  CHOLHDL 2.1     Assessment/Plan 1. Essential hypertension, benign -controlled on coreg, chlorthalidone and lisinopril   2. Chronic obstructive pulmonary disease, unspecified COPD, unspecified chronic bronchitis type -stable on spiriva without worsening symptoms or recent exacerbation   3. Exocrine pancreatic insufficiency Improved on creon, will cont current dose  4. Vascular dementia with behavioral disturbance -has been stable,  daughter primary care giver conts to go to day program  -conts on aricept and namenda   5. Other and unspecified hyperlipidemia -conts on zocor, will update labs - Comprehensive metabolic panel - Lipid panel  6. Special screening for malignant neoplasms, colon - Ambulatory referral to Gastroenterology for screening colonoscopy

## 2013-07-10 NOTE — Patient Instructions (Signed)
Follow up in august for physical

## 2013-07-11 ENCOUNTER — Other Ambulatory Visit: Payer: Self-pay | Admitting: Nurse Practitioner

## 2013-07-11 LAB — LIPID PANEL
CHOLESTEROL TOTAL: 158 mg/dL (ref 100–199)
Chol/HDL Ratio: 1.8 ratio units (ref 0.0–4.4)
HDL: 90 mg/dL (ref >39)
LDL CALC: 57 mg/dL (ref 0–99)
Triglycerides: 53 mg/dL (ref 0–149)
VLDL CHOLESTEROL CAL: 11 mg/dL (ref 5–40)

## 2013-07-11 LAB — COMPREHENSIVE METABOLIC PANEL
A/G RATIO: 1.2 (ref 1.1–2.5)
ALBUMIN: 4.2 g/dL (ref 3.5–4.8)
ALT: 19 IU/L (ref 0–32)
AST: 19 IU/L (ref 0–40)
Alkaline Phosphatase: 84 IU/L (ref 39–117)
BUN / CREAT RATIO: 25 (ref 11–26)
BUN: 25 mg/dL (ref 8–27)
CHLORIDE: 103 mmol/L (ref 97–108)
CO2: 28 mmol/L (ref 18–29)
Calcium: 10.2 mg/dL (ref 8.7–10.3)
Creatinine, Ser: 1 mg/dL (ref 0.57–1.00)
GFR calc Af Amer: 65 mL/min/{1.73_m2} (ref >59)
GFR, EST NON AFRICAN AMERICAN: 57 mL/min/{1.73_m2} — AB (ref >59)
GLUCOSE: 82 mg/dL (ref 65–99)
Globulin, Total: 3.4 g/dL (ref 1.5–4.5)
POTASSIUM: 4.4 mmol/L (ref 3.5–5.2)
Sodium: 146 mmol/L — ABNORMAL HIGH (ref 134–144)
TOTAL PROTEIN: 7.6 g/dL (ref 6.0–8.5)
Total Bilirubin: 0.3 mg/dL (ref 0.0–1.2)

## 2013-07-14 ENCOUNTER — Telehealth: Payer: Self-pay | Admitting: *Deleted

## 2013-07-14 NOTE — Telephone Encounter (Signed)
Message copied by Eilene Ghazi on Mon Jul 14, 2013 12:29 PM ------      Message from: Pricilla Larsson      Created: Fri Jul 11, 2013  5:55 PM       Make sure Ms Blaydes is staying well hydrated, labs are stable, cholesterol looks good. ------

## 2013-07-14 NOTE — Telephone Encounter (Signed)
Patient having hard time breathing and is Wheezing, wanted [place] to call something in. Advised daughter to take her mother to an Urgent care to be evaluated due to the breathing. She Agreed.

## 2013-07-14 NOTE — Telephone Encounter (Signed)
Spoke with daughter about her Mother's labs, she stated that she would make sure that she stays hydrated.

## 2013-07-18 ENCOUNTER — Other Ambulatory Visit: Payer: Self-pay | Admitting: Nurse Practitioner

## 2013-08-01 ENCOUNTER — Encounter: Payer: Self-pay | Admitting: Internal Medicine

## 2013-08-18 ENCOUNTER — Encounter: Payer: Self-pay | Admitting: Nurse Practitioner

## 2013-09-11 ENCOUNTER — Encounter: Payer: Self-pay | Admitting: Nurse Practitioner

## 2013-09-11 ENCOUNTER — Ambulatory Visit (INDEPENDENT_AMBULATORY_CARE_PROVIDER_SITE_OTHER): Payer: Medicare Other | Admitting: Nurse Practitioner

## 2013-09-11 VITALS — BP 126/78 | HR 67 | Temp 98.2°F | Resp 10 | Ht 61.0 in | Wt 153.0 lb

## 2013-09-11 DIAGNOSIS — K8681 Exocrine pancreatic insufficiency: Secondary | ICD-10-CM

## 2013-09-11 DIAGNOSIS — J449 Chronic obstructive pulmonary disease, unspecified: Secondary | ICD-10-CM

## 2013-09-11 DIAGNOSIS — J4489 Other specified chronic obstructive pulmonary disease: Secondary | ICD-10-CM

## 2013-09-11 DIAGNOSIS — I10 Essential (primary) hypertension: Secondary | ICD-10-CM

## 2013-09-11 DIAGNOSIS — I509 Heart failure, unspecified: Secondary | ICD-10-CM | POA: Diagnosis not present

## 2013-09-11 DIAGNOSIS — Z1231 Encounter for screening mammogram for malignant neoplasm of breast: Secondary | ICD-10-CM | POA: Diagnosis not present

## 2013-09-11 DIAGNOSIS — F0151 Vascular dementia with behavioral disturbance: Secondary | ICD-10-CM

## 2013-09-11 DIAGNOSIS — F0150 Vascular dementia without behavioral disturbance: Secondary | ICD-10-CM

## 2013-09-11 DIAGNOSIS — E785 Hyperlipidemia, unspecified: Secondary | ICD-10-CM

## 2013-09-11 DIAGNOSIS — F01518 Vascular dementia, unspecified severity, with other behavioral disturbance: Secondary | ICD-10-CM

## 2013-09-11 DIAGNOSIS — K8689 Other specified diseases of pancreas: Secondary | ICD-10-CM

## 2013-09-11 MED ORDER — MEMANTINE HCL-DONEPEZIL HCL ER 28-10 MG PO CP24: 1.0000 | ORAL_CAPSULE | Freq: Every day | ORAL | Status: DC

## 2013-09-11 MED ORDER — TETANUS-DIPHTH-ACELL PERTUSSIS 5-2.5-18.5 LF-MCG/0.5 IM SUSP: 0.5000 mL | Freq: Once | INTRAMUSCULAR | Status: DC

## 2013-09-11 MED ORDER — ZOSTER VACCINE LIVE 19400 UNT/0.65ML ~~LOC~~ SOLR: 0.6500 mL | Freq: Once | SUBCUTANEOUS | Status: DC

## 2013-09-11 MED ORDER — TIOTROPIUM BROMIDE MONOHYDRATE 18 MCG IN CAPS: 18.0000 ug | ORAL_CAPSULE | Freq: Every day | RESPIRATORY_TRACT | Status: DC

## 2013-09-11 NOTE — Addendum Note (Signed)
Addended by: Pricilla Larsson on: 09/11/2013 01:21 PM   Modules accepted: Level of Service

## 2013-09-11 NOTE — Progress Notes (Signed)
Patient ID: Carol Monroe, female   DOB: 1940/08/31, 73 y.o.   MRN: [id number]     No Known Allergies  Chief Complaint  Patient presents with  . Annual Exam    Yearly check-up, no recent labs . No recent ekg and no pap  . Referral    F/U on colonoscopy referral  . Orders    Mammogram and Bone density order request     HPI: Patient is a 73 y.o. female seen in the office today for extended visit, form to be filled out for adult daycare. Pt conts to go to adult daycare 3 days a week, otherwise daughter with her at home.  Has not had colonoscopy Needs mammogram Needs Tdap Diet- heart healthy Exercise- irregular, no routine exercise  Dementia- no changes in congitive status at home per daughter, still bathing, cleaning, dressing and feeding herself.  Review of Systems:  Review of Systems  Constitutional: Negative for fever, chills, weight loss and malaise/fatigue.  HENT: Negative for hearing loss.   Eyes: Negative for blurred vision.  Respiratory: Negative for shortness of breath.   Cardiovascular: Negative for chest pain, palpitations and leg swelling.  Gastrointestinal: Negative for heartburn, nausea, vomiting, abdominal pain, diarrhea, constipation, blood in stool and melena.  Genitourinary: Negative for dysuria, urgency and frequency.       Reports some incontinence of bowel and bladder  Musculoskeletal: Negative for falls.  Skin: Negative for rash.  Neurological: Negative for dizziness, loss of consciousness, weakness and headaches.  Endo/Heme/Allergies: Does not bruise/bleed easily.  Psychiatric/Behavioral: Positive for memory loss. Negative for depression. The patient is not nervous/anxious and does not have insomnia.      Past Medical History  Diagnosis Date  . CHF (congestive heart failure)   . COPD (chronic obstructive pulmonary disease)   . Stroke   . Hypertension   . Heart attack   . Murmur, heart   . Dementia    Past Surgical History  Procedure Laterality  Date  . Abdominal hysterectomy      Partial    Social History:   reports that she quit smoking about 7 years ago. Her smoking use included Cigarettes. She has a 80 pack-year smoking history. She does not have any smokeless tobacco history on file. She reports that she does not drink alcohol or use illicit drugs.  Family History  Problem Relation Age of Onset  . Heart disease Mother   . Diabetes Mother   . Diabetes Father     Medications: Patient's Medications  New Prescriptions   No medications on file  Previous Medications   CARVEDILOL (COREG CR) 40 MG 24 HR CAPSULE    Take 1 capsule (40 mg total) by mouth daily.   CHLORTHALIDONE (HYGROTON) 25 MG TABLET    Take 25 mg by mouth. 1/2 by mouth daily   LISINOPRIL (PRINIVIL,ZESTRIL) 40 MG TABLET    Take 1 tablet (40 mg total) by mouth daily.   MEMANTINE HCL-DONEPEZIL HCL (NAMZARIC) 28-10 MG CP24    Take by mouth daily.   PANCRELIPASE, LIP-PROT-AMYL, 24000 UNITS CPEP    Take 1 capsule (24,000 Units total) by mouth 3 (three) times daily with meals. For diarrhea   POTASSIUM CHLORIDE SA (K-DUR,KLOR-CON) 20 MEQ TABLET    One daily Mon, Wed, Fri, and Sat.   SIMVASTATIN (ZOCOR) 20 MG TABLET    TAKE ONE TABLET BY MOUTH IN THE EVENING   SIMVASTATIN (ZOCOR) 20 MG TABLET    TAKE ONE TABLET BY MOUTH ONCE DAILY IN THE  EVENING   TIOTROPIUM (SPIRIVA HANDIHALER) 18 MCG INHALATION CAPSULE    Place 1 capsule (18 mcg total) into inhaler and inhale daily.  Modified Medications   No medications on file  Discontinued Medications   DONEPEZIL (ARICEPT) 10 MG TABLET    Take 1 tablet (10 mg total) by mouth at bedtime.   MEMANTINE HCL ER (NAMENDA XR) 28 MG CP24    Take 1 tablet daily for memory.     Physical Exam:  Filed Vitals:   09/11/13 0922  BP: 126/78  Pulse: 67  Temp: 98.2 F (36.8 C)  TempSrc: Oral  Resp: 10  Height: 5\' 1"  (1.549 m)  Weight: 153 lb (69.4 kg)  SpO2: 93%    Physical Exam  Vitals reviewed. Constitutional: She is  well-developed, well-nourished, and in no distress.  HENT:  Mouth/Throat: Oropharynx is clear and moist. No oropharyngeal exudate.  Eyes: Conjunctivae and EOM are normal. Pupils are equal, round, and reactive to light.  Neck: Normal range of motion. Neck supple. No thyromegaly present.  Cardiovascular: Normal rate, regular rhythm and normal heart sounds.   Pulmonary/Chest: Effort normal and breath sounds normal. No respiratory distress.  Abdominal: Soft. Bowel sounds are normal. She exhibits no distension and no mass. There is no tenderness. There is no rebound and no guarding.  Musculoskeletal: She exhibits no edema.  Lymphadenopathy:    She has no cervical adenopathy.  Neurological: She is alert.  Skin: Skin is warm and dry. She is not diaphoretic.  Psychiatric: Affect normal.    Labs reviewed: Basic Metabolic Panel:  Recent Labs  01/09/13 1549 03/27/13 1652 07/10/13 1614  NA 143 142 146*  K 5.3* 4.4 4.4  CL 103 101 103  CO2 27 27 28   GLUCOSE 78 114* 82  BUN 17 18 25   CREATININE 1.20* 0.84 1.00  CALCIUM 9.6 9.7 10.2   Liver Function Tests:  Recent Labs  01/09/13 1549 03/27/13 1652 07/10/13 1614  AST 16 16 19   ALT 18 13 19   ALKPHOS 88 76 84  BILITOT <0.2 0.2 0.3  PROT 7.1 7.3 7.6    Recent Labs  01/09/13 1549  LIPASE 86*  AMYLASE 150*   No results found for this basename: AMMONIA,  in the last 8760 hours CBC:  Recent Labs  01/09/13 1549 03/27/13 1652  WBC 9.4 8.6  NEUTROABS 5.0 4.4  HGB 12.6 12.5  HCT 38.5 39.7  MCV 84 85  PLT 268 280   Lipid Panel:  Recent Labs  10/16/12 1710 07/10/13 1614  HDL 75 90  LDLCALC 70 57  TRIG 80 53  CHOLHDL 2.1 1.8     Assessment/Plan 1. Other screening mammogram - MM DIGITAL SCREENING BILATERAL; Future  2. Essential hypertension, benign -well controlled on current medications - CBC With differential/Platelet; Future  3. Congestive heart failure, unspecified congestive heart failure chronicity,  unspecified congestive heart failure type -conts on lasix and potassium, no worsening of symptoms or exacerbations in the last year  4. Vascular dementia with behavioral disturbance -has remained stable, behaviors only occasionally at home -MMSE 18/30 which has improved from last year of 16/30 -cognitive and functional function has actually improved in the last year since she has moved in with daughter.  -willing to change on namzaric, will stop aricept and namenda  - Memantine HCl-Donepezil HCl (NAMZARIC) 28-10 MG CP24; Take 1 tablet by mouth daily. Take by mouth daily.  Dispense: 90 capsule; Refill: 1  5. Chronic obstructive pulmonary disease, unspecified COPD, unspecified chronic bronchitis type -  has remained stable, no exacerbations in the last year - tiotropium (SPIRIVA HANDIHALER) 18 MCG inhalation capsule; Place 1 capsule (18 mcg total) into inhaler and inhale daily.  Dispense: 30 capsule; Refill: 5  6. Exocrine pancreatic insufficiency -well controlled on pancreatic enzymes with meals   7. Other and unspecified hyperlipidemia -labs reviewed with daughter, conts heart healthy diet -ldl at goal, conts zocor - Comprehensive metabolic panel; Future  PREVENTIVE COUNSELING:  The patient was counseled regarding the appropriate use of regular self-examination of the breasts on a monthly basis, prevention of dental and periodontal disease, diet, regular sustained exercise for at least 30 minutes 5 times per week, routine screening interval for mammogram as recommended by the [place] and ACOG,  the proper use of sunscreen and protective clothing, tobacco use,  and recommended schedule for GI hemoccult testing, colonoscopy, cholesterol, thyroid and diabetes screening.  Day program form filled out  Will have pt follw up in 6 months with blood work prior to visit and will need EKG and pne vaccine at that time

## 2013-09-11 NOTE — Progress Notes (Signed)
Failed clock drawing  

## 2013-09-11 NOTE — Patient Instructions (Addendum)
To get toenails trimmed and Dentist  Has had the prevnar 13 (March 2015)- will need to wait 1 year to get the pneumococcal 23  Will print tdap and zostavax to take to pharmacy for administration

## 2013-09-19 ENCOUNTER — Telehealth: Payer: Self-pay

## 2013-09-19 NOTE — Telephone Encounter (Signed)
[place] Approval was received 09/18/2013-09/19/2014, faxed approval letter [phone] ([facility])

## 2013-09-19 NOTE — Telephone Encounter (Signed)
On 09/18/2013 I initiated Prior Auth for [place], form was received, completed and faxed back to [phone] Side Note: Paperwork is located in triage area, Rodena Piety (Office manager) is aware that we are awaiting response from [facility].

## 2013-10-01 ENCOUNTER — Ambulatory Visit
Admission: RE | Admit: 2013-10-01 | Discharge: 2013-10-01 | Disposition: A | Discharge status: Home / Self Care | Payer: Medicare Other | Source: Ambulatory Visit | Attending: Nurse Practitioner | Admitting: Nurse Practitioner

## 2013-10-01 DIAGNOSIS — Z1231 Encounter for screening mammogram for malignant neoplasm of breast: Secondary | ICD-10-CM

## 2013-10-17 ENCOUNTER — Encounter: Payer: Self-pay | Admitting: Internal Medicine

## 2013-10-19 ENCOUNTER — Other Ambulatory Visit: Payer: Self-pay | Admitting: Nurse Practitioner

## 2013-11-09 ENCOUNTER — Other Ambulatory Visit: Payer: Self-pay | Admitting: Internal Medicine

## 2013-11-11 ENCOUNTER — Other Ambulatory Visit: Payer: Self-pay | Admitting: Nurse Practitioner

## 2013-11-12 ENCOUNTER — Other Ambulatory Visit: Payer: Self-pay | Admitting: *Deleted

## 2013-11-12 DIAGNOSIS — F0151 Vascular dementia with behavioral disturbance: Secondary | ICD-10-CM

## 2013-11-12 DIAGNOSIS — F01518 Vascular dementia, unspecified severity, with other behavioral disturbance: Secondary | ICD-10-CM

## 2013-11-12 MED ORDER — MEMANTINE HCL-DONEPEZIL HCL ER 28-10 MG PO CP24: 1.0000 | ORAL_CAPSULE | Freq: Every day | ORAL | Status: DC

## 2013-12-02 ENCOUNTER — Telehealth: Payer: Self-pay | Admitting: *Deleted

## 2013-12-02 NOTE — Telephone Encounter (Signed)
It is not clear to me whether she is appropriate for screening or not. Please schedule her to see one of the extenders to determine if she is appropriate for routine screening or not. Possibly some other option would be more appropriate. In any event, contact the patient (daughter), cancel previsit, cancel colonoscopy, and set her up to see an extender. No emergency obviously

## 2013-12-02 NOTE — Telephone Encounter (Signed)
Dr Henrene Pastor,  I was reviewing this pt's chart today as she has a PV scheduled for Thursday this week. She has a direct colon scheduled with you on 11-25 Wednesday at 0930. She has a medical history of dementia, HTN, stroke, COPD, CHF, MI, and heart murmur. She saw Dr Silvio Pate 09-11-2013 for a physical and in his note he stated she lives with her daughter, she feeds herself, dresses herself, bathes and has no change in her cognitive status. With her medical history I just wanted to see if you wanted to proceed with her colon as scheduled or if you thought she needed an OV since she was direct. She has never had a colonoscopy before per her chart.  Please advise. Thanks for your time,  Marijean Niemann

## 2013-12-03 ENCOUNTER — Ambulatory Visit (INDEPENDENT_AMBULATORY_CARE_PROVIDER_SITE_OTHER): Payer: Medicare Other | Admitting: Internal Medicine

## 2013-12-03 ENCOUNTER — Ambulatory Visit (INDEPENDENT_AMBULATORY_CARE_PROVIDER_SITE_OTHER): Payer: Medicare Other

## 2013-12-03 ENCOUNTER — Encounter: Payer: Self-pay | Admitting: Internal Medicine

## 2013-12-03 ENCOUNTER — Other Ambulatory Visit: Payer: Self-pay | Admitting: *Deleted

## 2013-12-03 VITALS — BP 146/88 | HR 82 | Resp 10 | Wt 157.0 lb

## 2013-12-03 DIAGNOSIS — R0600 Dyspnea, unspecified: Secondary | ICD-10-CM | POA: Insufficient documentation

## 2013-12-03 DIAGNOSIS — J449 Chronic obstructive pulmonary disease, unspecified: Secondary | ICD-10-CM

## 2013-12-03 DIAGNOSIS — Z23 Encounter for immunization: Secondary | ICD-10-CM | POA: Diagnosis not present

## 2013-12-03 MED ORDER — SIMVASTATIN 20 MG PO TABS: ORAL_TABLET | ORAL | Status: DC

## 2013-12-03 MED ORDER — FLUTICASONE-SALMETEROL 100-50 MCG/DOSE IN AEPB: 1.0000 | INHALATION_SPRAY | Freq: Two times a day (BID) | RESPIRATORY_TRACT | Status: DC

## 2013-12-03 MED ORDER — ALBUTEROL SULFATE HFA 108 (90 BASE) MCG/ACT IN AERS: 2.0000 | INHALATION_SPRAY | Freq: Four times a day (QID) | RESPIRATORY_TRACT | Status: DC | PRN

## 2013-12-03 MED ORDER — TIOTROPIUM BROMIDE MONOHYDRATE 18 MCG IN CAPS: 18.0000 ug | ORAL_CAPSULE | Freq: Every day | RESPIRATORY_TRACT | Status: DC

## 2013-12-03 NOTE — Progress Notes (Signed)
Patient ID: Carol Monroe, female   DOB: 06-17-1940, 73 y.o.   MRN: [id number]    Chief Complaint  Patient presents with  . Acute Visit    COPD, patient needs a back-up inhaler   . Immunizations    Flu vaccine   No Known Allergies  HPI 73 y/o female pt is here with her daughter with concerns for shortness of breath. She has been getting short of breath for last 3 months and feels it is progressing. She has hx of copd, chf and on review of her flowsheet, has gained 10 lbs since 6/15. She denies any orthopnea or leg edema. Denies any chest pain or palpitation. Has not seen her cardiology for almost a year now. Has dry cough more in the am with her trying to clear her throat. Has been a smoker in the past but quit for 6-7 years now. She has been wheezing  Wt Readings from Last 3 Encounters:  12/03/13 157 lb (71.215 kg)  09/11/13 153 lb (69.4 kg)  07/10/13 147 lb (66.679 kg)   ROS Denies fever or chills Denies sore throat, runny nose or chest/ nasal congestion Denies abdominal pain, nausea, vomiting Denies bowel/ bladder complaints  Past Medical History  Diagnosis Date  . CHF (congestive heart failure)   . COPD (chronic obstructive pulmonary disease)   . Stroke   . Hypertension   . Heart attack   . Murmur, heart   . Dementia    Current Outpatient Prescriptions on File Prior to Visit  Medication Sig Dispense Refill  . carvedilol (COREG CR) 40 MG 24 hr capsule Take 1 capsule (40 mg total) by mouth daily. 90 capsule 3  . chlorthalidone (HYGROTON) 25 MG tablet Take 25 mg by mouth. 1/2 by mouth daily    . CREON 24000 UNITS CPEP TAKE ONE CAPSULE BY MOUTH 3 TIMES A DAY WITH MEALS 90 capsule 3  . KLOR-CON M20 20 MEQ tablet TAKE 1 TABLET EVERY DAY ON MON,WED,FRI AND SAT 48 tablet 1  . lisinopril (PRINIVIL,ZESTRIL) 40 MG tablet TAKE 1 TABLET (40 MG TOTAL) BY MOUTH DAILY. 90 tablet 1  . Memantine HCl-Donepezil HCl (NAMZARIC) 28-10 MG CP24 Take 1 tablet by mouth daily. Take one tablet by  mouth once daily to preserve memory 90 capsule 1  . Tdap (BOOSTRIX) 5-2.5-18.5 LF-MCG/0.5 injection Inject 0.5 mLs into the muscle once. 0.5 mL 0  . zoster vaccine live, PF, (ZOSTAVAX) 96222 UNT/0.65ML injection Inject 19,400 Units into the skin once. 1 each 0   No current facility-administered medications on file prior to visit.   Physical exam BP 146/88 mmHg  Pulse 82  Resp 10  Wt 157 lb (71.215 kg)  SpO2 91%  Constitutional: She is well-developed, well-nourished, and in no distress.    Mouth/Throat: Oropharynx is clear and moist.  Eyes: Conjunctivae and EOM are normal. Pupils are equal, round, and reactive to light.  Neck: Normal range of motion. Neck supple. No thyromegaly present. No JVD Cardiovascular: Normal rate, regular rhythm and normal heart sounds.   Pulmonary/Chest: Effort normal but has poor air entry and expiratory wheezing bilaterally. No use of accessory muscles  Abdominal: Soft. Non tender  Musculoskeletal: She exhibits no edema.  Lymphadenopathy: She has no cervical adenopathy.  Neurological: She is alert.  Skin: Skin is warm and dry. She is not diaphoretic.  Psychiatric: Affect normal.   Assessment/plan  1. Chronic obstructive pulmonary disease, unspecified COPD, unspecified chronic bronchitis type No signs of exacerbation. Continue her spiriva and add advair  bid with prn albuterol for now. Reassess if no improvement. Will get cxr to assess for emphysema/ chronic bronchitis changes - tiotropium (SPIRIVA HANDIHALER) 18 MCG inhalation capsule; Place 1 capsule (18 mcg total) into inhaler and inhale daily.  Dispense: 30 capsule; Refill: 5 - DG Chest 2 View; Future - albuterol (PROVENTIL HFA;VENTOLIN HFA) 108 (90 BASE) MCG/ACT inhaler; Inhale 2 puffs into the lungs every 6 (six) hours as needed for wheezing or shortness of breath.  Dispense: 1 Inhaler; Refill: 0 - Fluticasone-Salmeterol (ADVAIR) 100-50 MCG/DOSE AEPB; Inhale 1 puff into the lungs 2 (two) times daily.   Dispense: 1 each; Refill: 3  2. Dyspnea Her copd progression, chf and weight gain could all be contributing to this. Copd med changes made as above, advised to see cardiology and might benefit from echocardiogram to assess for systolic impairment. Continue her cardiac meds. Dietary counselling advised. Reassess if no improvement

## 2013-12-03 NOTE — Telephone Encounter (Signed)
I spoke with Carol Monroe daughter this am and explained to her that her mother needs an appointment with an extender per Dr Henrene Pastor due to her medical history and that we need to cancel her PV tomorrow and her colon that is scheduled for 11-25 until after this office visit.  Then we can schedule her colon after this if necessary. She verbalized understanding of this, but states she is getting ready to leave her house for a doctor appointment and she needs to call me back. I gave her the number to the GI office to call and schedule this extender appt. She returned understanding of instructions given.    Carol Monroe PV

## 2013-12-03 NOTE — Telephone Encounter (Signed)
Received Prior Auth from [facility] for Advair. Alternate Medications are Symbicort and Dulera. Per Dr. Bubba Camp change to Symbicort 160-4.50mcg inhaler Two puffs twice a day. 1 inhaler with 3 refills. Called Rx into Pharmacy [facility] # [phone]

## 2013-12-05 ENCOUNTER — Telehealth: Payer: Self-pay | Admitting: *Deleted

## 2013-12-05 NOTE — Telephone Encounter (Signed)
Cecille Rubin with [facility] called and stated that they needed an order for patient to receive inhaler at the Center. Faxed Dr. Jackolyn Confer OV notes to her at Fax # [phone] after getting daughter's, [name],  Verbal ok

## 2013-12-06 ENCOUNTER — Other Ambulatory Visit: Payer: Self-pay | Admitting: Nurse Practitioner

## 2013-12-07 ENCOUNTER — Other Ambulatory Visit: Payer: Self-pay | Admitting: Nurse Practitioner

## 2013-12-11 NOTE — Telephone Encounter (Signed)
Lori with [facility] called back and stated what was faxed would not work for them to administer inhalers at [facility]. Wrote out the inhalers on a script pad, [name] signed and refaxed to [facility] Fax [phone].

## 2013-12-15 ENCOUNTER — Other Ambulatory Visit: Payer: Self-pay | Admitting: *Deleted

## 2013-12-15 MED ORDER — CARVEDILOL PHOSPHATE ER 40 MG PO CP24: 40.0000 mg | ORAL_CAPSULE | Freq: Every day | ORAL | Status: DC

## 2013-12-17 ENCOUNTER — Encounter: Payer: Medicare Other | Admitting: Internal Medicine

## 2013-12-17 ENCOUNTER — Ambulatory Visit: Payer: Medicare Other | Admitting: Gastroenterology

## 2013-12-25 ENCOUNTER — Ambulatory Visit (INDEPENDENT_AMBULATORY_CARE_PROVIDER_SITE_OTHER): Payer: Medicare Other | Admitting: Gastroenterology

## 2013-12-25 ENCOUNTER — Encounter: Payer: Self-pay | Admitting: Gastroenterology

## 2013-12-25 VITALS — BP 150/86 | HR 72 | Ht 60.5 in | Wt 157.0 lb

## 2013-12-25 DIAGNOSIS — Z1211 Encounter for screening for malignant neoplasm of colon: Secondary | ICD-10-CM | POA: Diagnosis not present

## 2013-12-25 MED ORDER — MOVIPREP 100 G PO SOLR: 1.0000 | Freq: Once | ORAL | Status: DC

## 2013-12-25 NOTE — Progress Notes (Signed)
12/25/2013 Carol Monroe [id number] 11/18/1940   HISTORY OF PRESENT ILLNESS:  This is a 73 year old female who is here today with her daughter to discuss colonoscopy.  Never had one in the past.  Denies any GI complaints including rectal bleeding.  No family history of colon cancer.  Her PMH includes CVA in the 1990's, HTN, mild COPD, and mild to moderate dementia.  Past Medical History  Diagnosis Date  . CHF (congestive heart failure)   . COPD (chronic obstructive pulmonary disease)   . Stroke   . Hypertension   . Heart attack   . Murmur, heart   . Dementia    Past Surgical History  Procedure Laterality Date  . Abdominal hysterectomy      Partial     reports that she quit smoking about 7 years ago. Her smoking use included Cigarettes. She has a 80 pack-year smoking history. She has never used smokeless tobacco. She reports that she does not drink alcohol or use illicit drugs. family history includes Breast cancer in her daughter; Diabetes in her father and mother; Heart disease in her mother. No Known Allergies    Outpatient Encounter Prescriptions as of 12/25/2013  Medication Sig  . albuterol (PROVENTIL HFA;VENTOLIN HFA) 108 (90 BASE) MCG/ACT inhaler Inhale 2 puffs into the lungs every 6 (six) hours as needed for wheezing or shortness of breath.  . carvedilol (COREG CR) 40 MG 24 hr capsule Take 1 capsule (40 mg total) by mouth daily.  . chlorthalidone (HYGROTON) 25 MG tablet Take 25 mg by mouth. 1/2 by mouth daily  . CREON 24000 UNITS CPEP TAKE ONE CAPSULE BY MOUTH 3 TIMES A DAY WITH MEALS  . Fluticasone-Salmeterol (ADVAIR) 100-50 MCG/DOSE AEPB Inhale 1 puff into the lungs 2 (two) times daily.  Marland Kitchen KLOR-CON M20 20 MEQ tablet TAKE 1 TABLET EVERY DAY ON MON,WED,FRI AND SAT  . lisinopril (PRINIVIL,ZESTRIL) 40 MG tablet TAKE 1 TABLET (40 MG TOTAL) BY MOUTH DAILY.  Marland Kitchen Memantine HCl ER (NAMENDA XR) 28 MG CP24 Take 1 capsule daily for memory.  . Memantine HCl-Donepezil HCl (NAMZARIC)  28-10 MG CP24 Take 1 tablet by mouth daily. Take one tablet by mouth once daily to preserve memory  . simvastatin (ZOCOR) 20 MG tablet TAKE ONE TABLET BY MOUTH ONCE DAILY IN THE EVENING  . tiotropium (SPIRIVA HANDIHALER) 18 MCG inhalation capsule Place 1 capsule (18 mcg total) into inhaler and inhale daily.  . Tdap (BOOSTRIX) 5-2.5-18.5 LF-MCG/0.5 injection Inject 0.5 mLs into the muscle once. (Patient not taking: Reported on 12/25/2013)  . zoster vaccine live, PF, (ZOSTAVAX) 16384 UNT/0.65ML injection Inject 19,400 Units into the skin once. (Patient not taking: Reported on 12/25/2013)     REVIEW OF SYSTEMS  : All other systems reviewed and negative except where noted in the History of Present Illness.   PHYSICAL EXAM: BP 150/86 mmHg  Pulse 72  Ht 5' 0.5" (1.537 m)  Wt 157 lb (71.215 kg)  BMI 30.15 kg/m2 General: Well developed black female in no acute distress Head: Normocephalic and atraumatic Eyes:  Sclerae anicteric, conjunctiva pink. Ears: Normal auditory acuity Lungs: Clear throughout to auscultation Heart: Regular rate and rhythm Abdomen: Soft, non-distended.  Normal bowel sounds.  Non-tender. Rectal:  Will be done at the time of colonoscopy. Musculoskeletal: Symmetrical with no gross deformities  Skin: No lesions on visible extremities Extremities: No edema  Neurological: Alert oriented x 4, grossly non-focal Psychological:  Alert and cooperative. Normal mood and affect  ASSESSMENT AND  PLAN: -Screening colonoscopy:  Will schedule with Dr. Henrene Pastor.  The risks, benefits, and alternatives were discussed with the patient and she consents to proceed.

## 2013-12-25 NOTE — Patient Instructions (Signed)
You have been scheduled for a colonoscopy with Dr. Henrene Pastor. Please follow written instructions given to you at your visit today.  Please pick up your prep kit at the pharmacy within the next 1-3 days. If you use inhalers (even only as needed), please bring them with you on the day of your procedure. Your physician has requested that you go to www.startemmi.com and enter the access code given to you at your visit today. This web site gives a general overview about your procedure. However, you should still follow specific instructions given to you by our office regarding your preparation for the procedure.

## 2013-12-25 NOTE — Progress Notes (Signed)
Agree with assessment and plans 

## 2014-01-29 ENCOUNTER — Ambulatory Visit (AMBULATORY_SURGERY_CENTER): Payer: Medicare Other | Admitting: Internal Medicine

## 2014-01-29 ENCOUNTER — Encounter: Payer: Self-pay | Admitting: Internal Medicine

## 2014-01-29 VITALS — BP 155/90 | HR 80 | Temp 96.9°F | Resp 17 | Ht 60.0 in | Wt 157.0 lb

## 2014-01-29 DIAGNOSIS — I10 Essential (primary) hypertension: Secondary | ICD-10-CM | POA: Diagnosis not present

## 2014-01-29 DIAGNOSIS — Z8673 Personal history of transient ischemic attack (TIA), and cerebral infarction without residual deficits: Secondary | ICD-10-CM | POA: Diagnosis not present

## 2014-01-29 DIAGNOSIS — D124 Benign neoplasm of descending colon: Secondary | ICD-10-CM

## 2014-01-29 DIAGNOSIS — I509 Heart failure, unspecified: Secondary | ICD-10-CM | POA: Diagnosis not present

## 2014-01-29 DIAGNOSIS — Z1211 Encounter for screening for malignant neoplasm of colon: Secondary | ICD-10-CM

## 2014-01-29 DIAGNOSIS — J449 Chronic obstructive pulmonary disease, unspecified: Secondary | ICD-10-CM | POA: Diagnosis not present

## 2014-01-29 DIAGNOSIS — I252 Old myocardial infarction: Secondary | ICD-10-CM | POA: Diagnosis not present

## 2014-01-29 DIAGNOSIS — I2510 Atherosclerotic heart disease of native coronary artery without angina pectoris: Secondary | ICD-10-CM | POA: Diagnosis not present

## 2014-01-29 MED ORDER — FLEET ENEMA 7-19 GM/118ML RE ENEM
1.0000 | ENEMA | Freq: Once | RECTAL | Status: AC
  Administered 2014-01-29: 1 via RECTAL

## 2014-01-29 MED ORDER — SODIUM CHLORIDE 0.9 % IV SOLN: 500.0000 mL | INTRAVENOUS | Status: DC

## 2014-01-29 NOTE — Op Note (Signed)
[place]  [address] [place], [address]   COLONOSCOPY PROCEDURE REPORT  PATIENT: Carol, Monroe  MR#: [id number] BIRTHDATE: Sep 12, 1940 , 73  yrs. old GENDER: female ENDOSCOPIST: Eustace Quail, MD REFERRED AX:[id number] Dewaine Oats, NP PROCEDURE DATE:  01/29/2014 PROCEDURE:   Colonoscopy with snare polypectomy x 1 First Screening Colonoscopy - Avg.  risk and is 50 yrs.  old or older Yes.  Prior Negative Screening - Now for repeat screening. N/A  History of Adenoma - Now for follow-up colonoscopy & has been > or = to 3 yrs.  N/A  Polyps Removed Today? Yes. ASA CLASS:   Class III INDICATIONS:average risk for colorectal cancer. MEDICATIONS: Monitored anesthesia care and Propofol 200 mg IV  DESCRIPTION OF PROCEDURE:   After the risks benefits and alternatives of the procedure were thoroughly explained, informed consent was obtained.  The digital rectal exam revealed no abnormalities of the rectum.   The [id number]  endoscope was introduced through the anus and advanced to the cecum, which was identified by both the appendix and ileocecal valve. No adverse events experienced.   The quality of the prep was good, using MoviPrep  The instrument was then slowly withdrawn as the colon was fully examined.  COLON FINDINGS: A single polyp measuring 15 mm in size was found in the descending colon.  A polypectomy was performed using snare cautery.  The resection was complete, the polyp tissue was completely retrieved and sent to histology.   There was severe diverticulosis noted throughout the entire examined colon.   The examination was otherwise normal.  Retroflexed views revealed no abnormalities. The time to cecum=2 minutes 58 seconds.  Withdrawal time=11 minutes 39 seconds.  The scope was withdrawn and the procedure completed. COMPLICATIONS: There were no immediate complications.  ENDOSCOPIC IMPRESSION: 1.   Single polyp measuring 15 mm was found in the  descending colon; polypectomy was performed using snare cautery 2.   Severe diverticulosis was noted throughout the entire examined colon 3.   The examination was otherwise normal  RECOMMENDATIONS: 1. Repeat Colonoscopy in 3 years , if medically fit.  eSigned:  Eustace Quail, MD 01/29/2014 2:56 PM   cc: The Patient   ; Sherrie Mustache, NP [facility])

## 2014-01-29 NOTE — Patient Instructions (Signed)
YOU HAD AN ENDOSCOPIC PROCEDURE TODAY AT [facility]: Refer to the procedure report that was given to you for any specific questions about what was found during the examination.  If the procedure report does not answer your questions, please call your gastroenterologist to clarify.  If you requested that your care partner not be given the details of your procedure findings, then the procedure report has been included in a sealed envelope for you to review at your convenience later.  YOU SHOULD EXPECT: Some feelings of bloating in the abdomen. Passage of more gas than usual.  Walking can help get rid of the air that was put into your GI tract during the procedure and reduce the bloating. If you had a lower endoscopy (such as a colonoscopy or flexible sigmoidoscopy) you may notice spotting of blood in your stool or on the toilet paper. If you underwent a bowel prep for your procedure, then you may not have a normal bowel movement for a few days.  DIET: Your first meal following the procedure should be a light meal and then it is ok to progress to your normal diet.  A half-sandwich or bowl of soup is an example of a good first meal.  Heavy or fried foods are harder to digest and may make you feel nauseous or bloated.  Likewise meals heavy in dairy and vegetables can cause extra gas to form and this can also increase the bloating.  Drink plenty of fluids but you should avoid alcoholic beverages for 24 hours. Try to increase the fiber in your diet.  ACTIVITY: Your care partner should take you home directly after the procedure.  You should plan to take it easy, moving slowly for the rest of the day.  You can resume normal activity the day after the procedure however you should NOT DRIVE or use heavy machinery for 24 hours (because of the sedation medicines used during the test).    SYMPTOMS TO REPORT IMMEDIATELY: A gastroenterologist can be reached at any hour.  During normal business hours, 8:30  AM to 5:00 PM Monday through Friday, call [phone].  After hours and on weekends, please call the GI answering service at [phone] who will take a message and have the physician on call contact you.   Following lower endoscopy (colonoscopy or flexible sigmoidoscopy):  Excessive amounts of blood in the stool  Significant tenderness or worsening of abdominal pains  Swelling of the abdomen that is new, acute  Fever of 100F or higher  FOLLOW UP: If any biopsies were taken you will be contacted by phone or by letter within the next 1-3 weeks.  Call your gastroenterologist if you have not heard about the biopsies in 3 weeks.  Our staff will call the home number listed on your records the next business day following your procedure to check on you and address any questions or concerns that you may have at that time regarding the information given to you following your procedure. This is a courtesy call and so if there is no answer at the home number and we have not heard from you through the emergency physician on call, we will assume that you have returned to your regular daily activities without incident.  SIGNATURES/CONFIDENTIALITY: You and/or your care partner have signed paperwork which will be entered into your electronic medical record.  These signatures attest to the fact that that the information above on your After Visit Summary has been reviewed and is understood.  Full responsibility of the confidentiality of this discharge information lies with you and/or your care-partner.  Please, read all of the handouts given to you by your recovery room nurse.

## 2014-01-29 NOTE — Progress Notes (Signed)
Called to room to assist during endoscopic procedure.  Patient ID and intended procedure confirmed with present staff. Received instructions for my participation in the procedure from the performing physician.  

## 2014-01-29 NOTE — Progress Notes (Signed)
Report to PACU, RN, vss, BBS= Clear.  

## 2014-01-30 ENCOUNTER — Telehealth: Payer: Self-pay

## 2014-01-30 NOTE — Telephone Encounter (Signed)
  Follow up Call-  Call back number 01/29/2014  Post procedure Call Back phone  # [phone]  Permission to leave phone message Yes     Patient questions:  Do you have a fever, pain , or abdominal swelling? No. Pain Score  0 *  Have you tolerated food without any problems? Yes.    Have you been able to return to your normal activities? Yes.    Do you have any questions about your discharge instructions: Diet   No. Medications  No. Follow up visit  No.  Do you have questions or concerns about your Care? No.  Actions: * If pain score is 4 or above: No action needed, pain <4.

## 2014-02-04 ENCOUNTER — Encounter: Payer: Self-pay | Admitting: Internal Medicine

## 2014-02-25 ENCOUNTER — Other Ambulatory Visit: Payer: Self-pay | Admitting: Internal Medicine

## 2014-03-19 ENCOUNTER — Other Ambulatory Visit: Payer: Self-pay | Admitting: Internal Medicine

## 2014-03-26 ENCOUNTER — Other Ambulatory Visit: Payer: Self-pay | Admitting: Nurse Practitioner

## 2014-03-27 ENCOUNTER — Emergency Department (HOSPITAL_COMMUNITY)
Admission: EM | Admit: 2014-03-27 | Discharge: 2014-03-27 | Disposition: A | Discharge status: Home / Self Care | Payer: Medicare Other | Attending: Emergency Medicine | Admitting: Emergency Medicine

## 2014-03-27 ENCOUNTER — Encounter (HOSPITAL_COMMUNITY): Payer: Self-pay | Admitting: Family Medicine

## 2014-03-27 ENCOUNTER — Telehealth: Payer: Self-pay | Admitting: *Deleted

## 2014-03-27 DIAGNOSIS — I10 Essential (primary) hypertension: Secondary | ICD-10-CM | POA: Diagnosis not present

## 2014-03-27 DIAGNOSIS — Z8673 Personal history of transient ischemic attack (TIA), and cerebral infarction without residual deficits: Secondary | ICD-10-CM | POA: Diagnosis not present

## 2014-03-27 DIAGNOSIS — Z79899 Other long term (current) drug therapy: Secondary | ICD-10-CM | POA: Insufficient documentation

## 2014-03-27 DIAGNOSIS — Z7951 Long term (current) use of inhaled steroids: Secondary | ICD-10-CM | POA: Diagnosis not present

## 2014-03-27 DIAGNOSIS — F0390 Unspecified dementia without behavioral disturbance: Secondary | ICD-10-CM | POA: Insufficient documentation

## 2014-03-27 DIAGNOSIS — I509 Heart failure, unspecified: Secondary | ICD-10-CM | POA: Insufficient documentation

## 2014-03-27 DIAGNOSIS — Z87891 Personal history of nicotine dependence: Secondary | ICD-10-CM | POA: Diagnosis not present

## 2014-03-27 DIAGNOSIS — R011 Cardiac murmur, unspecified: Secondary | ICD-10-CM | POA: Diagnosis not present

## 2014-03-27 DIAGNOSIS — J449 Chronic obstructive pulmonary disease, unspecified: Secondary | ICD-10-CM | POA: Insufficient documentation

## 2014-03-27 LAB — URINALYSIS, ROUTINE W REFLEX MICROSCOPIC
Bilirubin Urine: NEGATIVE
Glucose, UA: NEGATIVE mg/dL
Hgb urine dipstick: NEGATIVE
Ketones, ur: NEGATIVE mg/dL
LEUKOCYTES UA: NEGATIVE
NITRITE: NEGATIVE
PH: 5 (ref 5.0–8.0)
PROTEIN: NEGATIVE mg/dL
Specific Gravity, Urine: 1.025 (ref 1.005–1.030)
Urobilinogen, UA: 0.2 mg/dL (ref 0.0–1.0)

## 2014-03-27 LAB — CBC
HCT: 43.4 % (ref 36.0–46.0)
Hemoglobin: 13.1 g/dL (ref 12.0–15.0)
MCH: 26 pg (ref 26.0–34.0)
MCHC: 30.2 g/dL (ref 30.0–36.0)
MCV: 86.3 fL (ref 78.0–100.0)
PLATELETS: 259 10*3/uL (ref 150–400)
RBC: 5.03 MIL/uL (ref 3.87–5.11)
RDW: 18.7 % — AB (ref 11.5–15.5)
WBC: 9.9 10*3/uL (ref 4.0–10.5)

## 2014-03-27 LAB — BASIC METABOLIC PANEL
ANION GAP: 9 (ref 5–15)
BUN: 23 mg/dL (ref 6–23)
CO2: 26 mmol/L (ref 19–32)
Calcium: 9.5 mg/dL (ref 8.4–10.5)
Chloride: 103 mmol/L (ref 96–112)
Creatinine, Ser: 1.14 mg/dL — ABNORMAL HIGH (ref 0.50–1.10)
GFR, EST AFRICAN AMERICAN: 54 mL/min — AB (ref >90)
GFR, EST NON AFRICAN AMERICAN: 47 mL/min — AB (ref >90)
GLUCOSE: 70 mg/dL (ref 70–99)
POTASSIUM: 4.3 mmol/L (ref 3.5–5.1)
Sodium: 138 mmol/L (ref 135–145)

## 2014-03-27 MED ORDER — CLONIDINE HCL 0.1 MG PO TABS
0.1000 mg | ORAL_TABLET | Freq: Once | ORAL | Status: AC
  Administered 2014-03-27: 0.1 mg via ORAL
  Filled 2014-03-27: qty 1

## 2014-03-27 NOTE — ED Provider Notes (Signed)
CSN: [id number]     Arrival date & time 03/27/14  1431 History   First MD Initiated Contact with Patient 03/27/14 1609     Chief Complaint  Patient presents with  . Hypertension     (Consider location/radiation/quality/duration/timing/severity/associated sxs/prior Treatment) HPI Comments: Patient sent to the ER from daycare center for evaluation of elevated blood pressure. Patient was found to be hypertensive this afternoon. The center checked her blood pressure multiple times and remained high, was sent to the ER for further evaluation. Patient denies any symptoms. She specifically denies headache, blurred vision, chest pain, palpitations, shortness of breath. She does report that she is treated for hypertension, has taken all of her medications as prescribed. There has not been any recent changes in her medications or missed doses.  Patient is a 74 y.o. female presenting with hypertension.  Hypertension Pertinent negatives include no chest pain, no headaches and no shortness of breath.    Past Medical History  Diagnosis Date  . CHF (congestive heart failure)   . COPD (chronic obstructive pulmonary disease)   . Stroke   . Hypertension   . Heart attack   . Murmur, heart   . Dementia    Past Surgical History  Procedure Laterality Date  . Abdominal hysterectomy      Partial    Family History  Problem Relation Age of Onset  . Heart disease Mother   . Diabetes Mother   . Diabetes Father   . Breast cancer Daughter    History  Substance Use Topics  . Smoking status: Former Smoker -- 2.00 packs/day for 40 years    Types: Cigarettes    Quit date: 01/24/2006  . Smokeless tobacco: Never Used  . Alcohol Use: No   OB History    No data available     Review of Systems  Eyes: Negative for visual disturbance.  Respiratory: Negative for shortness of breath.   Cardiovascular: Negative for chest pain.  Neurological: Negative for headaches.  All other systems reviewed and are  negative.     Allergies  Review of patient's allergies indicates no known allergies.  Home Medications   Prior to Admission medications   Medication Sig Start Date End Date Taking? Authorizing Provider  albuterol (PROVENTIL HFA;VENTOLIN HFA) 108 (90 BASE) MCG/ACT inhaler Inhale 2 puffs into the lungs every 6 (six) hours as needed for wheezing or shortness of breath. 12/03/13   Blanchie Serve, MD  carvedilol (COREG CR) 40 MG 24 hr capsule Take 1 capsule (40 mg total) by mouth daily. 12/15/13   Tiffany L Reed, DO  chlorthalidone (HYGROTON) 25 MG tablet TAKE 1/2 TABLET IN THE MORNING 02/26/14   Lauree Chandler, NP  COREG CR 40 MG 24 hr capsule TAKE ONE CAPSULE BY MOUTH ONCE DAILY 03/27/14   Lauree Chandler, NP  CREON 24000 UNITS CPEP TAKE ONE CAPSULE BY MOUTH 3 TIMES A DAY WITH MEALS 03/19/14   Tiffany L Reed, DO  Fluticasone-Salmeterol (ADVAIR) 100-50 MCG/DOSE AEPB Inhale 1 puff into the lungs 2 (two) times daily. 12/03/13   Mahima Pandey, MD  KLOR-CON M20 20 MEQ tablet TAKE 1 TABLET EVERY DAY ON MON,WED,FRI AND SAT 11/11/13   Lauree Chandler, NP  lisinopril (PRINIVIL,ZESTRIL) 40 MG tablet TAKE 1 TABLET (40 MG TOTAL) BY MOUTH DAILY. 10/20/13   Lauree Chandler, NP  Memantine HCl ER (NAMENDA XR) 28 MG CP24 Take 1 capsule daily for memory. 12/08/13   Hennie Duos, MD  Memantine HCl-Donepezil HCl [facility]) 28-10  MG CP24 Take 1 tablet by mouth daily. Take one tablet by mouth once daily to preserve memory 11/12/13   Estill Dooms, MD  simvastatin (ZOCOR) 20 MG tablet TAKE ONE TABLET BY MOUTH ONCE DAILY IN THE EVENING 12/03/13   Blanchie Serve, MD  Tdap (BOOSTRIX) 5-2.5-18.5 LF-MCG/0.5 injection Inject 0.5 mLs into the muscle once. Patient not taking: Reported on 12/25/2013 09/11/13   Lauree Chandler, NP  tiotropium (SPIRIVA HANDIHALER) 18 MCG inhalation capsule Place 1 capsule (18 mcg total) into inhaler and inhale daily. 12/03/13   Blanchie Serve, MD  zoster vaccine live, PF, (ZOSTAVAX) 93734  UNT/0.65ML injection Inject 19,400 Units into the skin once. Patient not taking: Reported on 12/25/2013 09/11/13   Lauree Chandler, NP   BP 159/82 mmHg  Pulse 71  Temp(Src) 98.1 F (36.7 C) (Oral)  Resp 22  SpO2 94% Physical Exam  Constitutional: She is oriented to person, place, and time. She appears well-developed and well-nourished. No distress.  HENT:  Head: Normocephalic and atraumatic.  Right Ear: Hearing normal.  Left Ear: Hearing normal.  Nose: Nose normal.  Mouth/Throat: Oropharynx is clear and moist and mucous membranes are normal.  Eyes: Conjunctivae and EOM are normal. Pupils are equal, round, and reactive to light.  Neck: Normal range of motion. Neck supple.  Cardiovascular: Regular rhythm, S1 normal and S2 normal.  Exam reveals no gallop and no friction rub.   No murmur heard. Pulmonary/Chest: Effort normal and breath sounds normal. No respiratory distress. She exhibits no tenderness.  Abdominal: Soft. Normal appearance and bowel sounds are normal. There is no hepatosplenomegaly. There is no tenderness. There is no rebound, no guarding, no tenderness at McBurney's point and negative Murphy's sign. No hernia.  Musculoskeletal: Normal range of motion.  Neurological: She is alert and oriented to person, place, and time. She has normal strength. No cranial nerve deficit or sensory deficit. Coordination normal. GCS eye subscore is 4. GCS verbal subscore is 5. GCS motor subscore is 6.  Extraocular muscle movement: normal No visual field cut Pupils: equal and reactive both direct and consensual response is normal No nystagmus present    Sensory function is intact to light touch, pinprick Proprioception intact  Grip strength 5/5 symmetric in upper extremities Lower extremity strength 5/5 against gravity No pronator drift Normal finger to nose bilaterally Normal heel to shin bilaterally  Gait: normal    Skin: Skin is warm, dry and intact. No rash noted. No cyanosis.   Psychiatric: She has a normal mood and affect. Her speech is normal and behavior is normal. Thought content normal.  Nursing note and vitals reviewed.   ED Course  Procedures (including critical care time) Labs Review Labs Reviewed  CBC - Abnormal; Notable for the following:    RDW 18.7 (*)    All other components within normal limits  BASIC METABOLIC PANEL - Abnormal; Notable for the following:    Creatinine, Ser 1.14 (*)    GFR calc non Af Amer 47 (*)    GFR calc Af Amer 54 (*)    All other components within normal limits  URINALYSIS, ROUTINE W REFLEX MICROSCOPIC    Imaging Review No results found.   EKG Interpretation   Date/Time:  Friday March 27 2014 16:31:58 EST Ventricular Rate:  73 PR Interval:  142 QRS Duration: 84 QT Interval:  436 QTC Calculation: 480 R Axis:   51 Text Interpretation:  Sinus rhythm Baseline wander in lead(s) I II aVR No  previous tracing Confirmed  by Betsey Holiday  MD, Harrell Gave [phone]) on 03/27/2014  4:44:05 PM      MDM   Final diagnoses:  Essential hypertension    Patient presented to the ER for evaluation of asymptomatic hypertension. Workup is entirely unremarkable. Blood pressure improved. Patient discharged with recommendations for medication changes, follow-up with primary doctor.    Orpah Greek, MD 03/29/14 [phone]

## 2014-03-27 NOTE — ED Notes (Signed)
Pt sent here from day center with hypertension. Pt has no other complaints. sts pt took meds this am.

## 2014-03-27 NOTE — Telephone Encounter (Signed)
Patient daughter called and stated that they are at the ER now due to extremely high BP's of 195/110. Daughter doesn't want to wait there, but is already checked in. Informed her that patient needs to be evaluated with high BP's. She agreed and will stay.

## 2014-03-27 NOTE — Discharge Instructions (Signed)
Take a full chlorthalidone tablet daily instead of one half tablet. Follow-up with your doctor Monday for repeat blood pressure check. Return to the ER for blurred vision, headache, chest pain, shortness of breath.  Hypertension Hypertension, commonly called high blood pressure, is when the force of blood pumping through your arteries is too strong. Your arteries are the blood vessels that carry blood from your heart throughout your body. A blood pressure reading consists of a higher number over a lower number, such as 110/72. The higher number (systolic) is the pressure inside your arteries when your heart pumps. The lower number (diastolic) is the pressure inside your arteries when your heart relaxes. Ideally you want your blood pressure below 120/80. Hypertension forces your heart to work harder to pump blood. Your arteries may become narrow or stiff. Having hypertension puts you at risk for heart disease, stroke, and other problems.  RISK FACTORS Some risk factors for high blood pressure are controllable. Others are not.  Risk factors you cannot control include:   Race. You may be at higher risk if you are African American.  Age. Risk increases with age.  Gender. Men are at higher risk than women before age 38 years. After age 30, women are at higher risk than men. Risk factors you can control include:  Not getting enough exercise or physical activity.  Being overweight.  Getting too much fat, sugar, calories, or salt in your diet.  Drinking too much alcohol. SIGNS AND SYMPTOMS Hypertension does not usually cause signs or symptoms. Extremely high blood pressure (hypertensive crisis) may cause headache, anxiety, shortness of breath, and nosebleed. DIAGNOSIS  To check if you have hypertension, your health care provider will measure your blood pressure while you are seated, with your arm held at the level of your heart. It should be measured at least twice using the same arm. Certain  conditions can cause a difference in blood pressure between your right and left arms. A blood pressure reading that is higher than normal on one occasion does not mean that you need treatment. If one blood pressure reading is high, ask your health care provider about having it checked again. TREATMENT  Treating high blood pressure includes making lifestyle changes and possibly taking medicine. Living a healthy lifestyle can help lower high blood pressure. You may need to change some of your habits. Lifestyle changes may include:  Following the DASH diet. This diet is high in fruits, vegetables, and whole grains. It is low in salt, red meat, and added sugars.  Getting at least 2 hours of brisk physical activity every week.  Losing weight if necessary.  Not smoking.  Limiting alcoholic beverages.  Learning ways to reduce stress. If lifestyle changes are not enough to get your blood pressure under control, your health care provider may prescribe medicine. You may need to take more than one. Work closely with your health care provider to understand the risks and benefits. HOME CARE INSTRUCTIONS  Have your blood pressure rechecked as directed by your health care provider.   Take medicines only as directed by your health care provider. Follow the directions carefully. Blood pressure medicines must be taken as prescribed. The medicine does not work as well when you skip doses. Skipping doses also puts you at risk for problems.   Do not smoke.   Monitor your blood pressure at home as directed by your health care provider. SEEK MEDICAL CARE IF:   You think you are having a reaction to medicines taken.  You have recurrent headaches or feel dizzy.  You have swelling in your ankles.  You have trouble with your vision. SEEK IMMEDIATE MEDICAL CARE IF:  You develop a severe headache or confusion.  You have unusual weakness, numbness, or feel faint.  You have severe chest or abdominal  pain.  You vomit repeatedly.  You have trouble breathing. MAKE SURE YOU:   Understand these instructions.  Will watch your condition.  Will get help right away if you are not doing well or get worse. Document Released: 01/09/2005 Document Revised: 05/26/2013 Document Reviewed: 11/01/2012 [facility] Patient Information 2015 [place], [place]. This information is not intended to replace advice given to you by your health care provider. Make sure you discuss any questions you have with your health care provider.

## 2014-04-07 ENCOUNTER — Other Ambulatory Visit: Payer: Medicare Other

## 2014-04-09 ENCOUNTER — Telehealth: Payer: Self-pay | Admitting: Nurse Practitioner

## 2014-04-09 ENCOUNTER — Ambulatory Visit (INDEPENDENT_AMBULATORY_CARE_PROVIDER_SITE_OTHER): Payer: Medicare Other | Admitting: Nurse Practitioner

## 2014-04-09 ENCOUNTER — Encounter: Payer: Self-pay | Admitting: Nurse Practitioner

## 2014-04-09 VITALS — BP 148/82 | HR 74 | Temp 98.5°F | Ht 60.0 in | Wt 156.4 lb

## 2014-04-09 DIAGNOSIS — K8681 Exocrine pancreatic insufficiency: Secondary | ICD-10-CM

## 2014-04-09 DIAGNOSIS — J449 Chronic obstructive pulmonary disease, unspecified: Secondary | ICD-10-CM

## 2014-04-09 DIAGNOSIS — R32 Unspecified urinary incontinence: Secondary | ICD-10-CM | POA: Diagnosis not present

## 2014-04-09 DIAGNOSIS — K868 Other specified diseases of pancreas: Secondary | ICD-10-CM

## 2014-04-09 DIAGNOSIS — E785 Hyperlipidemia, unspecified: Secondary | ICD-10-CM

## 2014-04-09 DIAGNOSIS — I10 Essential (primary) hypertension: Secondary | ICD-10-CM

## 2014-04-09 DIAGNOSIS — R609 Edema, unspecified: Secondary | ICD-10-CM | POA: Diagnosis not present

## 2014-04-09 DIAGNOSIS — F0390 Unspecified dementia without behavioral disturbance: Secondary | ICD-10-CM

## 2014-04-09 MED ORDER — DEPEND UNDERGARMENTS MISC: Status: DC

## 2014-04-09 MED ORDER — CHLORTHALIDONE 25 MG PO TABS: 25.0000 mg | ORAL_TABLET | Freq: Every morning | ORAL | Status: DC

## 2014-04-09 NOTE — Telephone Encounter (Signed)
There was a FMLA form dropped off by [employer] daughter Theressa Stamps to be filled out by Sherrie Mustache. The form was placed in the rx tray.

## 2014-04-09 NOTE — Progress Notes (Signed)
Patient ID: Carol Monroe, female   DOB: 1940-05-22, 74 y.o.   MRN: [id number]    PCP: Lauree Chandler, NP  No Known Allergies  Chief Complaint  Patient presents with  . Medical Management of Chronic Issues    7 month follow up. Patients blood pressure has been elevated for the past 2 weeks since they visited the emergency room (196/105). Patients ankles were swollen yesterday      HPI: Patient is a 74 y.o. female seen in the office today for routine follow up on chronic conditions Pt was seen in the ED after the day program took her blood pressure and it was elevated. ED doctor increased her chlorthalidone to 1 tablet (instead of 1/2). Pts daughter has not really been taking blood pressures at home. Day program has not reported high blood pressures.  Occasionally with have LE edema, goes away in the morning, worse at night At Last OV question about COPD- was supposed to get Chest xray to evaluate lungs but never went. Order still in computer.  incontinent of urine, content of stools.   Needs help bathing but able to dress herself.    Advanced Directive information Does patient have an advance directive?: Yes;No, Does patient want to make changes to advanced directive?: Yes - information given Review of Systems:  Review of Systems  Constitutional: Negative for fever and chills.  Respiratory: Negative for cough and shortness of breath.   Cardiovascular: Negative for chest pain, palpitations and leg swelling.  Gastrointestinal: Negative for nausea, vomiting, abdominal pain, diarrhea, constipation and blood in stool.  Genitourinary: Negative for dysuria, urgency and frequency.       Reports some incontinence of bowel and bladder  Skin: Negative for rash.  Neurological: Negative for dizziness, weakness and headaches.  Hematological: Does not bruise/bleed easily.  Psychiatric/Behavioral: The patient is not nervous/anxious.        Dementia    Past Medical History  Diagnosis Date    . CHF (congestive heart failure)   . COPD (chronic obstructive pulmonary disease)   . Stroke   . Hypertension   . Heart attack   . Murmur, heart   . Dementia    Past Surgical History  Procedure Laterality Date  . Abdominal hysterectomy      Partial    Social History:   reports that she quit smoking about 8 years ago. Her smoking use included Cigarettes. She has a 80 pack-year smoking history. She has never used smokeless tobacco. She reports that she does not drink alcohol or use illicit drugs.  Family History  Problem Relation Age of Onset  . Heart disease Mother   . Diabetes Mother   . Diabetes Father   . Breast cancer Daughter     Medications: Patient's Medications  New Prescriptions   No medications on file  Previous Medications   ALBUTEROL (PROVENTIL HFA;VENTOLIN HFA) 108 (90 BASE) MCG/ACT INHALER    Inhale 2 puffs into the lungs every 6 (six) hours as needed for wheezing or shortness of breath.   CARVEDILOL (COREG CR) 40 MG 24 HR CAPSULE    Take 1 capsule (40 mg total) by mouth daily.   CHLORTHALIDONE (HYGROTON) 25 MG TABLET    TAKE 1/2 TABLET IN THE MORNING   CREON 24000 UNITS CPEP    TAKE ONE CAPSULE BY MOUTH 3 TIMES A DAY WITH MEALS   FLUTICASONE-SALMETEROL (ADVAIR) 100-50 MCG/DOSE AEPB    Inhale 1 puff into the lungs 2 (two) times daily.   KLOR-CON  M20 20 MEQ TABLET    TAKE 1 TABLET EVERY DAY ON MON,WED,FRI AND SAT   LISINOPRIL (PRINIVIL,ZESTRIL) 40 MG TABLET    TAKE 1 TABLET (40 MG TOTAL) BY MOUTH DAILY.   MEMANTINE HCL-DONEPEZIL HCL (NAMZARIC) 28-10 MG CP24    Take 1 tablet by mouth daily. Take one tablet by mouth once daily to preserve memory   SIMVASTATIN (ZOCOR) 20 MG TABLET    TAKE ONE TABLET BY MOUTH ONCE DAILY IN THE EVENING   TIOTROPIUM (SPIRIVA HANDIHALER) 18 MCG INHALATION CAPSULE    Place 1 capsule (18 mcg total) into inhaler and inhale daily.  Modified Medications   No medications on file  Discontinued Medications   COREG CR 40 MG 24 HR CAPSULE     TAKE ONE CAPSULE BY MOUTH ONCE DAILY   MEMANTINE HCL ER (NAMENDA XR) 28 MG CP24    Take 1 capsule daily for memory.   TDAP (BOOSTRIX) 5-2.5-18.5 LF-MCG/0.5 INJECTION    Inject 0.5 mLs into the muscle once.   ZOSTER VACCINE LIVE, PF, (ZOSTAVAX) 75102 UNT/0.65ML INJECTION    Inject 19,400 Units into the skin once.     Physical Exam:  Filed Vitals:   04/09/14 1542  BP: 148/82  Pulse: 74  Temp: 98.5 F (36.9 C)  TempSrc: Oral  Height: 5' (1.524 m)  Weight: 156 lb 6.4 oz (70.943 kg)  SpO2: 96%    Physical Exam  Constitutional: She appears well-developed and well-nourished. No distress.  Neck: Normal range of motion. Neck supple.  Cardiovascular: Normal rate, regular rhythm and normal heart sounds.   Pulmonary/Chest: Effort normal and breath sounds normal. No respiratory distress.  Abdominal: Soft. Bowel sounds are normal. She exhibits no distension. There is no tenderness.  Musculoskeletal: Normal range of motion. She exhibits no edema or tenderness.  Neurological: She is alert.  Oriented to person and place only  Skin: Skin is warm and dry.  Psychiatric: She has a normal mood and affect.    Labs reviewed: Basic Metabolic Panel:  Recent Labs  07/10/13 1614 03/27/14 1621  NA 146* 138  K 4.4 4.3  CL 103 103  CO2 28 26  GLUCOSE 82 70  BUN 25 23  CREATININE 1.00 1.14*  CALCIUM 10.2 9.5   Liver Function Tests:  Recent Labs  07/10/13 1614  AST 19  ALT 19  ALKPHOS 84  BILITOT 0.3  PROT 7.6   No results for input(s): LIPASE, AMYLASE in the last 8760 hours. No results for input(s): AMMONIA in the last 8760 hours. CBC:  Recent Labs  03/27/14 1621  WBC 9.9  HGB 13.1  HCT 43.4  MCV 86.3  PLT 259   Lipid Panel:  Recent Labs  07/10/13 1614  CHOL 158  HDL 90  LDLCALC 57  TRIG 53  CHOLHDL 1.8   TSH: No results for input(s): TSH in the last 8760 hours. A1C: No results found for: HGBA1C   Assessment/Plan 1. Urinary incontinence, unspecified  incontinence type -unchanged, Rx provided for Incontinence Supply Disposable (DEPEND UNDERGARMENTS) MISC; To use as needed for urinary incontinence Dx: r32  Dispense: 30 each; Refill: 12  2. Edema -dependent edema due to venous insuffiencey, encouraged elevation and use of Compression stockings  3. Essential hypertension -blood pressure improved on increase of chlorthalidone, will follow up lab work at this time - chlorthalidone (HYGROTON) 25 MG tablet; Take 1 tablet (25 mg total) by mouth every morning.  Dispense: 45 tablet; Refill: 1  4. Hyperlipidemia - conts on Zocor, follow  up Comprehensive metabolic panel -LDL at goal in June of 2015.   5. 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 COPD Currently on Advair, no worsening shortness of breath -to get chest xray per pervious order, if unable to obtain with old order will reorder   7. Exocrine pancreatic insufficiency -diarrhea well controlled on creon    To follow up in 4 months,sooner if needed

## 2014-04-09 NOTE — Patient Instructions (Signed)
to get chest xray Follow up in 4 months sooner if needed

## 2014-04-10 ENCOUNTER — Other Ambulatory Visit: Payer: Self-pay

## 2014-04-10 DIAGNOSIS — N289 Disorder of kidney and ureter, unspecified: Secondary | ICD-10-CM

## 2014-04-10 DIAGNOSIS — E870 Hyperosmolality and hypernatremia: Secondary | ICD-10-CM

## 2014-04-10 LAB — COMPREHENSIVE METABOLIC PANEL
A/G RATIO: 1.1 (ref 1.1–2.5)
ALT: 16 IU/L (ref 0–32)
AST: 15 IU/L (ref 0–40)
Albumin: 3.9 g/dL (ref 3.5–4.8)
Alkaline Phosphatase: 69 IU/L (ref 39–117)
BUN/Creatinine Ratio: 21 (ref 11–26)
BUN: 26 mg/dL (ref 8–27)
Bilirubin Total: <0.2 mg/dL (ref 0.0–1.2)
CALCIUM: 9.6 mg/dL (ref 8.7–10.3)
CO2: 24 mmol/L (ref 18–29)
CREATININE: 1.21 mg/dL — AB (ref 0.57–1.00)
Chloride: 105 mmol/L (ref 97–108)
GFR calc Af Amer: 52 mL/min/{1.73_m2} — ABNORMAL LOW (ref >59)
GFR calc non Af Amer: 45 mL/min/{1.73_m2} — ABNORMAL LOW (ref >59)
GLOBULIN, TOTAL: 3.5 g/dL (ref 1.5–4.5)
Glucose: 86 mg/dL (ref 65–99)
POTASSIUM: 4.3 mmol/L (ref 3.5–5.2)
Sodium: 145 mmol/L — ABNORMAL HIGH (ref 134–144)
Total Protein: 7.4 g/dL (ref 6.0–8.5)

## 2014-04-13 NOTE — Telephone Encounter (Signed)
Carol Monroe took forms out of tray and had during patient's appointment. Did not fill out gave back to CMA to fill out today. Reviewed and filled out what I could. Given back to [place] to review and sign.

## 2014-04-15 ENCOUNTER — Other Ambulatory Visit: Payer: Self-pay | Admitting: Nurse Practitioner

## 2014-04-24 ENCOUNTER — Other Ambulatory Visit: Payer: Self-pay

## 2014-04-27 ENCOUNTER — Other Ambulatory Visit: Payer: Self-pay

## 2014-04-28 ENCOUNTER — Other Ambulatory Visit: Payer: Self-pay | Admitting: Nurse Practitioner

## 2014-05-18 ENCOUNTER — Other Ambulatory Visit: Payer: Self-pay | Admitting: Nurse Practitioner

## 2014-06-16 ENCOUNTER — Other Ambulatory Visit: Payer: Self-pay | Admitting: Internal Medicine

## 2014-06-20 ENCOUNTER — Other Ambulatory Visit: Payer: Self-pay | Admitting: Internal Medicine

## 2014-06-20 DIAGNOSIS — I509 Heart failure, unspecified: Secondary | ICD-10-CM

## 2014-06-24 ENCOUNTER — Other Ambulatory Visit: Payer: Self-pay | Admitting: Internal Medicine

## 2014-07-01 ENCOUNTER — Other Ambulatory Visit: Payer: Self-pay | Admitting: Internal Medicine

## 2014-07-09 ENCOUNTER — Other Ambulatory Visit: Payer: Self-pay | Admitting: Internal Medicine

## 2014-07-17 ENCOUNTER — Ambulatory Visit: Payer: Medicare Other | Admitting: Internal Medicine

## 2014-07-17 ENCOUNTER — Encounter: Payer: Self-pay | Admitting: Nurse Practitioner

## 2014-07-17 ENCOUNTER — Other Ambulatory Visit: Payer: Medicare Other

## 2014-07-17 DIAGNOSIS — N289 Disorder of kidney and ureter, unspecified: Secondary | ICD-10-CM

## 2014-07-17 DIAGNOSIS — I10 Essential (primary) hypertension: Secondary | ICD-10-CM

## 2014-07-17 DIAGNOSIS — E870 Hyperosmolality and hypernatremia: Secondary | ICD-10-CM | POA: Diagnosis not present

## 2014-07-18 LAB — BASIC METABOLIC PANEL
BUN/Creatinine Ratio: 16 (ref 11–26)
BUN: 16 mg/dL (ref 8–27)
CO2: 28 mmol/L (ref 18–29)
CREATININE: 1.02 mg/dL — AB (ref 0.57–1.00)
Calcium: 10 mg/dL (ref 8.7–10.3)
Chloride: 101 mmol/L (ref 97–108)
GFR calc Af Amer: 64 mL/min/{1.73_m2} (ref >59)
GFR, EST NON AFRICAN AMERICAN: 55 mL/min/{1.73_m2} — AB (ref >59)
Glucose: 104 mg/dL — ABNORMAL HIGH (ref 65–99)
Potassium: 4.5 mmol/L (ref 3.5–5.2)
SODIUM: 142 mmol/L (ref 134–144)

## 2014-07-18 LAB — CBC WITH DIFFERENTIAL
Basophils Absolute: 0 10*3/uL (ref 0.0–0.2)
Basos: 0 %
EOS (ABSOLUTE): 0.1 10*3/uL (ref 0.0–0.4)
Eos: 1 %
HEMOGLOBIN: 12.3 g/dL (ref 11.1–15.9)
Hematocrit: 40.2 % (ref 34.0–46.6)
IMMATURE GRANS (ABS): 0 10*3/uL (ref 0.0–0.1)
Immature Granulocytes: 0 %
Lymphocytes Absolute: 2.6 10*3/uL (ref 0.7–3.1)
Lymphs: 29 %
MCH: 25.1 pg — ABNORMAL LOW (ref 26.6–33.0)
MCHC: 30.6 g/dL — ABNORMAL LOW (ref 31.5–35.7)
MCV: 82 fL (ref 79–97)
Monocytes Absolute: 0.7 10*3/uL (ref 0.1–0.9)
Monocytes: 8 %
NEUTROS PCT: 62 %
Neutrophils Absolute: 5.3 10*3/uL (ref 1.4–7.0)
RBC: 4.91 x10E6/uL (ref 3.77–5.28)
RDW: 17.5 % — ABNORMAL HIGH (ref 12.3–15.4)
WBC: 8.8 10*3/uL (ref 3.4–10.8)

## 2014-07-19 ENCOUNTER — Other Ambulatory Visit: Payer: Self-pay | Admitting: Internal Medicine

## 2014-07-23 ENCOUNTER — Encounter: Payer: Self-pay | Admitting: Nurse Practitioner

## 2014-07-23 ENCOUNTER — Ambulatory Visit (INDEPENDENT_AMBULATORY_CARE_PROVIDER_SITE_OTHER): Payer: Medicare Other | Admitting: Nurse Practitioner

## 2014-07-23 VITALS — BP 130/78 | HR 96 | Temp 97.4°F | Resp 20 | Ht 60.0 in | Wt 157.4 lb

## 2014-07-23 DIAGNOSIS — R32 Unspecified urinary incontinence: Secondary | ICD-10-CM | POA: Diagnosis not present

## 2014-07-23 DIAGNOSIS — K868 Other specified diseases of pancreas: Secondary | ICD-10-CM | POA: Diagnosis not present

## 2014-07-23 DIAGNOSIS — R609 Edema, unspecified: Secondary | ICD-10-CM

## 2014-07-23 DIAGNOSIS — K8681 Exocrine pancreatic insufficiency: Secondary | ICD-10-CM

## 2014-07-23 DIAGNOSIS — J449 Chronic obstructive pulmonary disease, unspecified: Secondary | ICD-10-CM | POA: Diagnosis not present

## 2014-07-23 DIAGNOSIS — I10 Essential (primary) hypertension: Secondary | ICD-10-CM | POA: Diagnosis not present

## 2014-07-23 DIAGNOSIS — F0390 Unspecified dementia without behavioral disturbance: Secondary | ICD-10-CM | POA: Diagnosis not present

## 2014-07-23 MED ORDER — ARFORMOTEROL TARTRATE 15 MCG/2ML IN NEBU: INHALATION_SOLUTION | RESPIRATORY_TRACT | Status: DC

## 2014-07-23 MED ORDER — IPRATROPIUM-ALBUTEROL 0.5-2.5 (3) MG/3ML IN SOLN
3.0000 mL | Freq: Once | RESPIRATORY_TRACT | Status: AC
  Administered 2014-07-23: 3 mL via RESPIRATORY_TRACT

## 2014-07-23 NOTE — Progress Notes (Signed)
Patient ID: Carol Monroe, female   DOB: 06/17/1940, 74 y.o.   MRN: [id number]    PCP: Lauree Chandler, NP  No Known Allergies  Chief Complaint  Patient presents with  . Medical Management of Chronic Issues     HPI: Patient is a 74 y.o. female seen in the office today for routine follow up on chronic conditions Pt with pmh of COPD, HTN, pancreatic insuffiencey, advanced dementia,  daughter reports she is having a hard time getting her to use Advair, unsure if she is actual inhaling medication correctly due to dementia. daughter has to click medication in and then tells her to breath, not sure have effective that is. Did not take medication today. Increased shortness of breath with walking in this morning, still having shortness of breath Never went to get chest xray- will go today conts to go to day program routinely Memory and functional state has been stable, without acute changes per daughter Need physical in aug for her day program   Bowels moving good with supplement  Advanced Directive information Does patient have an advance directive?: No, Would patient like information on creating an advanced directive?: Yes - Educational materials given Review of Systems:  Review of Systems  Constitutional: Negative for fever and chills.  Respiratory: Positive for shortness of breath (worse today, did not take inhalers). Negative for cough.   Cardiovascular: Negative for chest pain, palpitations and leg swelling.  Gastrointestinal: Negative for nausea, vomiting, abdominal pain, diarrhea, constipation and blood in stool.  Genitourinary: Negative for dysuria, urgency and frequency.       Reports some incontinence of bowel and bladder  Skin: Negative for rash.  Neurological: Negative for dizziness, weakness and headaches.  Hematological: Does not bruise/bleed easily.  Psychiatric/Behavioral: The patient is not nervous/anxious.        Dementia    Past Medical History  Diagnosis Date    . CHF (congestive heart failure)   . COPD (chronic obstructive pulmonary disease)   . Stroke   . Hypertension   . Heart attack   . Murmur, heart   . Dementia    Past Surgical History  Procedure Laterality Date  . Abdominal hysterectomy      Partial    Social History:   reports that she quit smoking about 8 years ago. Her smoking use included Cigarettes. She has a 80 pack-year smoking history. She has never used smokeless tobacco. She reports that she does not drink alcohol or use illicit drugs.  Family History  Problem Relation Age of Onset  . Heart disease Mother   . Diabetes Mother   . Diabetes Father   . Breast cancer Daughter     Medications: Patient's Medications  New Prescriptions   No medications on file  Previous Medications   ALBUTEROL (PROVENTIL HFA;VENTOLIN HFA) 108 (90 BASE) MCG/ACT INHALER    Inhale 2 puffs into the lungs every 6 (six) hours as needed for wheezing or shortness of breath.   CARVEDILOL (COREG CR) 40 MG 24 HR CAPSULE    Take 1 capsule by mouth daily for CHF.   CHLORTHALIDONE (HYGROTON) 25 MG TABLET    Take 1 tablet (25 mg total) by mouth every morning.   COREG CR 40 MG 24 HR CAPSULE    TAKE 1 CAPSULE (40 MG TOTAL) BY MOUTH DAILY.   CREON 24000 UNITS CPEP    TAKE ONE CAPSULE BY MOUTH 3 TIMES A DAY WITH MEALS   FLUTICASONE-SALMETEROL (ADVAIR) 100-50 MCG/DOSE AEPB  Inhale 1 puff into the lungs 2 (two) times daily.   INCONTINENCE SUPPLY DISPOSABLE (DEPEND UNDERGARMENTS) MISC    To use as needed for urinary incontinence Dx: r32   KLOR-CON M20 20 MEQ TABLET    TAKE 1 TABLET EVERY DAY ON MON,WED,FRI AND SAT   LISINOPRIL (PRINIVIL,ZESTRIL) 40 MG TABLET    TAKE 1 TABLET (40 MG TOTAL) BY MOUTH DAILY.   MEMANTINE HCL-DONEPEZIL HCL (NAMZARIC) 28-10 MG CP24    Take 1 tablet by mouth daily. Take one tablet by mouth once daily to preserve memory   NAMZARIC 28-10 MG CP24    TAKE ONE CAPSULE BY MOUTH EVERY DAY   SIMVASTATIN (ZOCOR) 20 MG TABLET    TAKE ONE TABLET  BY MOUTH ONCE DAILY IN THE EVENING   SYMBICORT 160-4.5 MCG/ACT INHALER    INHALE 2 PUFFS TWICE DAILY   TIOTROPIUM (SPIRIVA HANDIHALER) 18 MCG INHALATION CAPSULE    Place 1 capsule (18 mcg total) into inhaler and inhale daily.  Modified Medications   No medications on file  Discontinued Medications   COREG CR 40 MG 24 HR CAPSULE    TAKE 1 CAPSULE (40 MG TOTAL) BY MOUTH DAILY.     Physical Exam:  Filed Vitals:   07/23/14 1027 07/23/14 1328  BP: 160/100 130/78  Pulse: 96   Temp: 97.4 F (36.3 C)   TempSrc: Oral   Resp: 20   Height: 5' (1.524 m)   Weight: 157 lb 6.4 oz (71.396 kg)   SpO2: 89%     Physical Exam  Constitutional: She appears well-developed and well-nourished. No distress.  Neck: Normal range of motion. Neck supple.  Cardiovascular: Normal rate, regular rhythm and normal heart sounds.   Pulmonary/Chest: Effort normal. She has decreased breath sounds.  Abdominal: Soft. Bowel sounds are normal. She exhibits no distension. There is no tenderness.  Musculoskeletal: Normal range of motion. She exhibits no edema or tenderness.  Neurological: She is alert.  Oriented to person and place only  Skin: Skin is warm and dry.  Psychiatric: She has a normal mood and affect.    Labs reviewed: Basic Metabolic Panel:  Recent Labs  03/27/14 1621 04/09/14 1646 07/17/14 0904  NA 138 145* 142  K 4.3 4.3 4.5  CL 103 105 101  CO2 26 24 28   GLUCOSE 70 86 104*  BUN 23 26 16   CREATININE 1.14* 1.21* 1.02*  CALCIUM 9.5 9.6 10.0   Liver Function Tests:  Recent Labs  04/09/14 1646  AST 15  ALT 16  ALKPHOS 69  BILITOT <0.2  PROT 7.4   No results for input(s): LIPASE, AMYLASE in the last 8760 hours. No results for input(s): AMMONIA in the last 8760 hours. CBC:  Recent Labs  03/27/14 1621 07/17/14 0904  WBC 9.9 8.8  NEUTROABS  --  5.3  HGB 13.1  --   HCT 43.4 40.2  MCV 86.3  --   PLT 259  --    Lipid Panel: No results for input(s): CHOL, HDL, LDLCALC, TRIG,  CHOLHDL, LDLDIRECT in the last 8760 hours. TSH: No results for input(s): TSH in the last 8760 hours. A1C: No results found for: HGBA1C   Assessment/Plan  1. Chronic obstructive pulmonary disease, unspecified COPD, unspecified chronic bronchitis type -increased shortness of breath today in office, did not take inhalers prior to visit, duoneb given in office with immediate relief  -to stop advair once brovana starts  - arformoterol (BROVANA) 15 MCG/2ML NEBU; 15 mcg/2L inhalation via neb twice daily  Dispense: 120 mL; Refill: 3 - DG Chest 2 View -cont on spiriva 18 mcg daily, to take after bronvana is given   2. Essential hypertension Improved on recheck, elevated with increased shortness of  Breath -cont on lisinopril and coreg  3. Edema Without worsening of symptoms   4. Dementia, without behavioral disturbance Progressive dementia, stable in the last few months but anticipate decline, conts on namzaric  5. Exocrine pancreatic insufficiency -cont on creon with good results  6. Urinary incontinence, unspecified incontinence type -unchanged  Follow up in august for annual exam   Carlos American. Harle Battiest  [facility] [phone] 8 am - 5 pm) [phone] (after hours)

## 2014-07-23 NOTE — Patient Instructions (Signed)
Will have you start brovana, STOP Adviar To cont Spiriva   Schedule physical in August in May

## 2014-07-29 ENCOUNTER — Ambulatory Visit
Admission: RE | Admit: 2014-07-29 | Discharge: 2014-07-29 | Disposition: A | Discharge status: Home / Self Care | Payer: Medicare Other | Source: Ambulatory Visit | Attending: Nurse Practitioner | Admitting: Nurse Practitioner

## 2014-07-29 DIAGNOSIS — J9811 Atelectasis: Secondary | ICD-10-CM | POA: Diagnosis not present

## 2014-08-12 ENCOUNTER — Other Ambulatory Visit: Payer: Self-pay | Admitting: Nurse Practitioner

## 2014-08-20 ENCOUNTER — Ambulatory Visit (INDEPENDENT_AMBULATORY_CARE_PROVIDER_SITE_OTHER): Payer: Medicare Other | Admitting: Nurse Practitioner

## 2014-08-20 ENCOUNTER — Encounter: Payer: Self-pay | Admitting: Nurse Practitioner

## 2014-08-20 VITALS — BP 120/80 | HR 81 | Temp 97.8°F | Resp 20 | Ht 60.0 in | Wt 158.2 lb

## 2014-08-20 DIAGNOSIS — K8681 Exocrine pancreatic insufficiency: Secondary | ICD-10-CM

## 2014-08-20 DIAGNOSIS — Z23 Encounter for immunization: Secondary | ICD-10-CM

## 2014-08-20 DIAGNOSIS — F0390 Unspecified dementia without behavioral disturbance: Secondary | ICD-10-CM

## 2014-08-20 DIAGNOSIS — Z0000 Encounter for general adult medical examination without abnormal findings: Secondary | ICD-10-CM | POA: Diagnosis not present

## 2014-08-20 DIAGNOSIS — I10 Essential (primary) hypertension: Secondary | ICD-10-CM | POA: Diagnosis not present

## 2014-08-20 DIAGNOSIS — K868 Other specified diseases of pancreas: Secondary | ICD-10-CM

## 2014-08-20 DIAGNOSIS — E785 Hyperlipidemia, unspecified: Secondary | ICD-10-CM | POA: Diagnosis not present

## 2014-08-20 DIAGNOSIS — J449 Chronic obstructive pulmonary disease, unspecified: Secondary | ICD-10-CM | POA: Diagnosis not present

## 2014-08-20 MED ORDER — ZOSTER VACCINE LIVE 19400 UNT/0.65ML ~~LOC~~ SOLR: 0.6500 mL | Freq: Once | SUBCUTANEOUS | Status: DC

## 2014-08-20 MED ORDER — TETANUS-DIPHTH-ACELL PERTUSSIS 5-2.5-18.5 LF-MCG/0.5 IM SUSP: 0.5000 mL | Freq: Once | INTRAMUSCULAR | Status: DC

## 2014-08-20 NOTE — Progress Notes (Addendum)
Patient ID: Carol Monroe, female   DOB: [birth year], 74 y.o.   MRN: [id number]    PCP: Lauree Chandler, NP  No Known Allergies  Chief Complaint  Patient presents with  . Annual Exam     HPI: Patient is a 74 y.o. female seen in the office today for annual wellness exam. Pt with a pmh of COPD, HTN, pancreatic insuffiencey, advanced dementia Still waiting on [employer] for brovana- supposed to come out today but having to reschedule to tomorrow No acute concerns today.  Screenings: Colon Cancer- done jan 2016, recommended every 3 years due to polyps  Breast Cancer- mammogram, last 9/92015 Cervical Cancer- aged out, partial hysterotomy  Osteoporosis- Dexa Scan has not had Depression screening-no depression  MMSE-  MMSE - Mini Mental State Exam 08/20/2014 09/11/2013 05/22/2012  Orientation to time 2 2 0  Orientation to Place 0 0 3  Registration 3 3 3   Attention/ Calculation 4 5 2   Recall 0 0 1  Language- name 2 objects 2 2 2   Language- repeat 1 1 1   Language- follow 3 step command 3 3 3   Language- read & follow direction 1 1 1   Write a sentence 1 1 0  Copy design 0 0 0  Total score 17 18 16      Falls- no falls in the last year   Vaccines Up to date on:  influenza, pneumococcal 1 of 2  Need:  Tdap, Zostavax  Smoking status: former smoker Alcohol use: none  Dentist: found a dentist, plans to go Ophthalmologist: needs to go  Exercise regimen: none, light exercise at the day program possible 15 mins 5 days  Diet: heart healthy, low sodium  Functional Status of ADLs:  Toileting and Dressing-independently  Bathing-assistance needed   Advanced Directive information Does patient have an advance directive?: No, Would patient like information on creating an advanced directive?: Yes - Educational materials given Review of Systems:  Review of Systems  Constitutional: Negative for fever and chills.  Respiratory: Positive for shortness of breath (worse at times). Negative for  cough.   Cardiovascular: Negative for chest pain, palpitations and leg swelling.  Gastrointestinal: Negative for nausea, vomiting, abdominal pain, diarrhea, constipation and blood in stool.       Incontinence of bowel and bladder   Genitourinary: Negative for dysuria, urgency and frequency.  Skin: Negative for rash.  Neurological: Negative for dizziness, weakness and headaches.  Hematological: Does not bruise/bleed easily.  Psychiatric/Behavioral: The patient is not nervous/anxious.        Dementia, memory loss stable    Past Medical History  Diagnosis Date  . CHF (congestive heart failure)   . COPD (chronic obstructive pulmonary disease)   . Stroke   . Hypertension   . Heart attack   . Murmur, heart   . Dementia    Past Surgical History  Procedure Laterality Date  . Abdominal hysterectomy      Partial    Social History:   reports that she quit smoking about 8 years ago. Her smoking use included Cigarettes. She has a 80 pack-year smoking history. She has never used smokeless tobacco. She reports that she does not drink alcohol or use illicit drugs.  Family History  Problem Relation Age of Onset  . Heart disease Mother   . Diabetes Mother   . Diabetes Father   . Breast cancer Daughter     Medications: Patient's Medications  New Prescriptions   No medications on file  Previous Medications   ALBUTEROL (  PROVENTIL HFA;VENTOLIN HFA) 108 (90 BASE) MCG/ACT INHALER    Inhale 2 puffs into the lungs every 6 (six) hours as needed for wheezing or shortness of breath.   ARFORMOTEROL (BROVANA) 15 MCG/2ML NEBU    15 mcg/2L inhalation via neb twice daily   CARVEDILOL (COREG CR) 40 MG 24 HR CAPSULE    Take 1 capsule by mouth daily for CHF.   CHLORTHALIDONE (HYGROTON) 25 MG TABLET    TAKE 1 TABLET (25 MG TOTAL) BY MOUTH EVERY MORNING.   CREON 24000 UNITS CPEP    TAKE ONE CAPSULE BY MOUTH 3 TIMES A DAY WITH MEALS   INCONTINENCE SUPPLY DISPOSABLE (DEPEND UNDERGARMENTS) MISC    To use as  needed for urinary incontinence Dx: r32   KLOR-CON M20 20 MEQ TABLET    TAKE 1 TABLET EVERY DAY ON MON,WED,FRI AND SAT   LISINOPRIL (PRINIVIL,ZESTRIL) 40 MG TABLET    TAKE 1 TABLET (40 MG TOTAL) BY MOUTH DAILY.   MEMANTINE HCL-DONEPEZIL HCL (NAMZARIC) 28-10 MG CP24    Take 1 tablet by mouth daily. Take one tablet by mouth once daily to preserve memory   SIMVASTATIN (ZOCOR) 20 MG TABLET    TAKE ONE TABLET BY MOUTH ONCE DAILY IN THE EVENING   SYMBICORT 160-4.5 MCG/ACT INHALER    INHALE 2 PUFFS TWICE DAILY   TIOTROPIUM (SPIRIVA HANDIHALER) 18 MCG INHALATION CAPSULE    Place 1 capsule (18 mcg total) into inhaler and inhale daily.  Modified Medications   No medications on file  Discontinued Medications   No medications on file     Physical Exam:  Filed Vitals:   08/20/14 1330  BP: 120/80  Pulse: 81  Temp: 97.8 F (36.6 C)  TempSrc: Oral  Resp: 20  Height: 5' (1.524 m)  Weight: 158 lb 3.2 oz (71.759 kg)  SpO2: 94%    Physical Exam  Constitutional: She appears well-developed and well-nourished. No distress.  HENT:  Head: Normocephalic and atraumatic.  Right Ear: External ear normal.  Left Ear: External ear normal.  Nose: Nose normal.  Mouth/Throat: Oropharynx is clear and moist. No oropharyngeal exudate.  Eyes: Conjunctivae and EOM are normal. Pupils are equal, round, and reactive to light.  Neck: Normal range of motion. Neck supple.  Cardiovascular: Normal rate, regular rhythm, normal heart sounds and intact distal pulses.   Pulmonary/Chest: Effort normal and breath sounds normal.  Abdominal: Soft. Bowel sounds are normal. She exhibits no distension. There is no tenderness.  Musculoskeletal: Normal range of motion. She exhibits no edema or tenderness.  Neurological: She is alert.  Oriented to person and place only  Skin: Skin is warm and dry.  Psychiatric: She has a normal mood and affect.    Labs reviewed: Basic Metabolic Panel:  Recent Labs  03/27/14 1621  04/09/14 1646 07/17/14 0904  NA 138 145* 142  K 4.3 4.3 4.5  CL 103 105 101  CO2 26 24 28   GLUCOSE 70 86 104*  BUN 23 26 16   CREATININE 1.14* 1.21* 1.02*  CALCIUM 9.5 9.6 10.0   Liver Function Tests:  Recent Labs  04/09/14 1646  AST 15  ALT 16  ALKPHOS 69  BILITOT <0.2  PROT 7.4   No results for input(s): LIPASE, AMYLASE in the last 8760 hours. No results for input(s): AMMONIA in the last 8760 hours. CBC:  Recent Labs  03/27/14 1621 07/17/14 0904  WBC 9.9 8.8  NEUTROABS  --  5.3  HGB 13.1  --   HCT 43.4 40.2  MCV 86.3  --   PLT 259  --    Lipid Panel: No results for input(s): CHOL, HDL, LDLCALC, TRIG, CHOLHDL, LDLDIRECT in the last 8760 hours. TSH: No results for input(s): TSH in the last 8760 hours. A1C: No results found for: HGBA1C   Assessment/Plan 1. Encounter for Medicare annual wellness exam Doing well living with daughter and at day program.  The patient was counseled regarding  prevention of dental and periodontal disease, diet, regular sustained exercise for at least 30 minutes 5 times per week, routine screening interval for mammogram as recommended by the [place] and ACOG,  and recommended schedule for GI hemoccult testing, colonoscopy, cholesterol, thyroid and diabetes screening. -MMSE stable in last 3 years -no signs of depression or anxiety -needs to increase exercise to 30 mins 5 days a week -will need Prevnar vaccine today -form completion for adult day program completed  2. Hyperlipidemia -conts on zocor, cont heart healthy diet and exercise modifications - Lipid panel  3. Chronic obstructive pulmonary disease, unspecified COPD, unspecified chronic bronchitis type  overall some increase in shortness of breath, has not started brovana but home health plans to come out tomorrow for twice daily neb, to stop Symbicort once bronvana starts   4. Exocrine pancreatic insufficiency Well controlled on creon with meals  5.  Essential hypertension, benign Blood pressure stable, conts on heart healthy low sodium diet. conts on chlorthalidone, coreg and lisinopril   6. Dementia, without behavioral disturbance MMSE stable, conts on namzaric daily   Jaquisha Frech K. Harle Battiest  [facility] [phone] 8 am - 5 pm) [phone] (after hours)

## 2014-08-20 NOTE — Addendum Note (Signed)
Addended by: Eilene Ghazi on: 08/20/2014 03:18 PM   Modules accepted: Orders

## 2014-08-20 NOTE — Progress Notes (Signed)
Did not pass the clock test. 

## 2014-08-21 LAB — LIPID PANEL
CHOL/HDL RATIO: 1.9 ratio (ref 0.0–4.4)
Cholesterol, Total: 162 mg/dL (ref 100–199)
HDL: 85 mg/dL (ref >39)
LDL CALC: 62 mg/dL (ref 0–99)
Triglycerides: 73 mg/dL (ref 0–149)
VLDL CHOLESTEROL CAL: 15 mg/dL (ref 5–40)

## 2014-11-13 ENCOUNTER — Other Ambulatory Visit: Payer: Self-pay | Admitting: Internal Medicine

## 2014-11-21 ENCOUNTER — Other Ambulatory Visit: Payer: Self-pay | Admitting: Nurse Practitioner

## 2014-11-25 ENCOUNTER — Other Ambulatory Visit: Payer: Self-pay | Admitting: Internal Medicine

## 2014-12-29 ENCOUNTER — Telehealth: Payer: Self-pay | Admitting: *Deleted

## 2014-12-29 NOTE — Telephone Encounter (Signed)
Carol Monroe, daughter called and stated patient needs prior authorization for Coreg CR 40mg . [employer] will no longer cover. I called [employer] [phone] and spoke with Colletta [place] and initiated Prior Auth for Coreg and it went into Clinical Review and will know something in 48-72 hours.  Daughter Notified and agreed.

## 2014-12-30 NOTE — Telephone Encounter (Signed)
Received fax from [facility] [phone] and they have DENIED Coreg coverage. Patient has to try alternative: atenolol, Metoprolol tartrate, Carvedilol, Bisoprolol Fumarate, Metoprolol Succinate ER. Please Advise.  Member #: [id number]

## 2014-12-31 NOTE — Telephone Encounter (Signed)
Why has she "failed" those other drugs?

## 2014-12-31 NOTE — Telephone Encounter (Signed)
Will they cover Carvedilol (generic coreg) 6.25 mg by mouth twice daily with meals  Follow up in office in 2 week after changing to follow up blood pressure and pulse.  If they could take blood pressure twice weekly and record BP and pulse and bring to office visit

## 2014-12-31 NOTE — Telephone Encounter (Signed)
No she needs to try one of those alternatives first before they will cover the Coreg. Patient has been on this medication since 2014 and the insurance is aware but still refused.

## 2015-01-01 MED ORDER — CARVEDILOL 6.25 MG PO TABS: 6.2500 mg | ORAL_TABLET | Freq: Two times a day (BID) | ORAL | Status: DC

## 2015-01-01 NOTE — Telephone Encounter (Signed)
[name], caregiver notified and agreed. Faxed Rx to pharmacy.

## 2015-01-14 ENCOUNTER — Telehealth: Payer: Self-pay

## 2015-01-14 NOTE — Telephone Encounter (Signed)
ID number [id number], [phone] Namzaric 28 mg-10 mg  I called to initiate PA, awaiting faxed form. Once form completed and faxed back, review process will take 24-72 hours

## 2015-01-15 NOTE — Telephone Encounter (Signed)
Received Form from [facility]. Filled out and given to [name] to review and sign. To be faxed back to [facility] Fax#: [phone]

## 2015-01-20 ENCOUNTER — Other Ambulatory Visit: Payer: Self-pay | Admitting: Nurse Practitioner

## 2015-01-20 NOTE — Telephone Encounter (Signed)
Received fax from [facility] [phone]. Namzaric APPROVED until 01/18/2017.

## 2015-01-23 ENCOUNTER — Other Ambulatory Visit: Payer: Self-pay | Admitting: Nurse Practitioner

## 2015-02-23 ENCOUNTER — Encounter: Payer: Self-pay | Admitting: Nurse Practitioner

## 2015-02-23 ENCOUNTER — Ambulatory Visit (INDEPENDENT_AMBULATORY_CARE_PROVIDER_SITE_OTHER): Payer: Medicare Other | Admitting: Nurse Practitioner

## 2015-02-23 VITALS — BP 140/102 | HR 71 | Temp 98.0°F | Resp 20 | Ht 60.0 in | Wt 160.6 lb

## 2015-02-23 DIAGNOSIS — J449 Chronic obstructive pulmonary disease, unspecified: Secondary | ICD-10-CM

## 2015-02-23 DIAGNOSIS — E785 Hyperlipidemia, unspecified: Secondary | ICD-10-CM

## 2015-02-23 DIAGNOSIS — I10 Essential (primary) hypertension: Secondary | ICD-10-CM | POA: Diagnosis not present

## 2015-02-23 DIAGNOSIS — K8681 Exocrine pancreatic insufficiency: Secondary | ICD-10-CM | POA: Diagnosis not present

## 2015-02-23 DIAGNOSIS — F0390 Unspecified dementia without behavioral disturbance: Secondary | ICD-10-CM

## 2015-02-23 MED ORDER — DONEPEZIL HCL 10 MG PO TABS: ORAL_TABLET | ORAL | Status: DC

## 2015-02-23 NOTE — Progress Notes (Signed)
Patient ID: Carol Monroe, female   DOB: 1940/09/22, 75 y.o.   MRN: [id number]    PCP: Lauree Chandler, NP  No Known Allergies  Chief Complaint  Patient presents with  . Medical Management of Chronic Issues    6 month follow-up for Copd, Hypertension,   . OTHER    Daughter in room with patient     HPI: Patient is a 75 y.o. female seen in the office today for routine follow up on chronic conditions.  Pt with pmh of COPD, HTN, pancreatic insuffiencey, advanced dementia. Daughter at visit providing history.  Out of namzaric, has not had in 3 weeks. Memory remains stable, no worsening cognitive or functional status. Worsening mood since off namzaric.   Taking brovana and spiriva which has helped her breathing.   Advanced Directive information Does patient have an advance directive?: No, Would patient like information on creating an advanced directive?: Yes - Educational materials given Review of Systems:  Review of Systems  Constitutional: Negative for fever and chills.  Respiratory: Negative for cough and shortness of breath.   Cardiovascular: Negative for chest pain, palpitations and leg swelling.  Gastrointestinal: Negative for nausea, vomiting, abdominal pain, diarrhea, constipation and blood in stool.  Genitourinary: Negative for dysuria, urgency and frequency.       Reports some incontinence of bowel and bladder  Skin: Negative for rash.  Neurological: Negative for dizziness, weakness and headaches.  Hematological: Does not bruise/bleed easily.  Psychiatric/Behavioral: The patient is not nervous/anxious.        Dementia    Past Medical History  Diagnosis Date  . CHF (congestive heart failure) ([place])   . COPD (chronic obstructive pulmonary disease) ([place])   . Stroke ([place])   . Hypertension   . Heart attack ([place])   . Murmur, heart   . Dementia    Past Surgical History  Procedure Laterality Date  . Abdominal hysterectomy      Partial    Social History:   reports  that she quit smoking about 9 years ago. Her smoking use included Cigarettes. She has a 80 pack-year smoking history. She has never used smokeless tobacco. She reports that she does not drink alcohol or use illicit drugs.  Family History  Problem Relation Age of Onset  . Heart disease Mother   . Diabetes Mother   . Diabetes Father   . Breast cancer Daughter     Medications: Patient's Medications  New Prescriptions   DONEPEZIL (ARICEPT) 10 MG TABLET    1/2 tablet for 1 week then 1 tablet daily  Previous Medications   ALBUTEROL (PROVENTIL HFA;VENTOLIN HFA) 108 (90 BASE) MCG/ACT INHALER    Inhale 2 puffs into the lungs every 6 (six) hours as needed for wheezing or shortness of breath.   ARFORMOTEROL (BROVANA) 15 MCG/2ML NEBU    15 mcg/2L inhalation via neb twice daily   CARVEDILOL (COREG) 6.25 MG TABLET    Take 1 tablet (6.25 mg total) by mouth 2 (two) times daily with a meal.   CHLORTHALIDONE (HYGROTON) 25 MG TABLET    TAKE 1 TABLET (25 MG TOTAL) BY MOUTH EVERY MORNING.   CREON 24000 UNITS CPEP    TAKE ONE CAPSULE BY MOUTH 3 TIMES A DAY WITH MEALS   KLOR-CON M20 20 MEQ TABLET    TAKE 1 TABLET EVERY DAY ON MON,WED,FRI AND SAT   LISINOPRIL (PRINIVIL,ZESTRIL) 40 MG TABLET    TAKE 1 TABLET (40 MG TOTAL) BY MOUTH DAILY.   MEMANTINE HCL-DONEPEZIL HCL (  NAMZARIC) 28-10 MG CP24    Take 1 tablet by mouth daily. Take one tablet by mouth once daily to preserve memory   SIMVASTATIN (ZOCOR) 20 MG TABLET    TAKE ONE TABLET BY MOUTH ONCE DAILY IN THE EVENING   SPIRIVA HANDIHALER 18 MCG INHALATION CAPSULE    PLACE 1 CAPSULE (18 MCG TOTAL) INTO INHALER AND INHALE DAILY.   TDAP (BOOSTRIX) 5-2.5-18.5 LF-MCG/0.5 INJECTION    Inject 0.5 mLs into the muscle once.   ZOSTER VACCINE LIVE, PF, (ZOSTAVAX) 09811 UNT/0.65ML INJECTION    Inject 19,400 Units into the skin once.  Modified Medications   No medications on file  Discontinued Medications   CARVEDILOL (COREG CR) 40 MG 24 HR CAPSULE    Take 1 capsule by mouth  daily for CHF.   INCONTINENCE SUPPLY DISPOSABLE (DEPEND UNDERGARMENTS) MISC    To use as needed for urinary incontinence Dx: r32   NAMZARIC 28-10 MG CP24    TAKE ONE CAPSULE BY MOUTH EVERY DAY     Physical Exam:  Filed Vitals:   02/23/15 1556  BP: 140/102  Pulse: 71  Temp: 98 F (36.7 C)  TempSrc: Oral  Resp: 20  Height: 5' (1.524 m)  Weight: 160 lb 9.6 oz (72.848 kg)  SpO2: 84%    Physical Exam  Constitutional: She appears well-developed and well-nourished. No distress.  Neck: Normal range of motion. Neck supple.  Cardiovascular: Normal rate, regular rhythm and normal heart sounds.   Pulmonary/Chest: Effort normal. She has decreased breath sounds.  Abdominal: Soft. Bowel sounds are normal. She exhibits no distension. There is no tenderness.  Musculoskeletal: Normal range of motion. She exhibits no edema or tenderness.  Neurological: She is alert.  Oriented to person and place only  Skin: Skin is warm and dry.  Psychiatric: She has a normal mood and affect.    Labs reviewed: Basic Metabolic Panel:  Recent Labs  03/27/14 1621 04/09/14 1646 07/17/14 0904  NA 138 145* 142  K 4.3 4.3 4.5  CL 103 105 101  CO2 26 24 28   GLUCOSE 70 86 104*  BUN 23 26 16   CREATININE 1.14* 1.21* 1.02*  CALCIUM 9.5 9.6 10.0   Liver Function Tests:  Recent Labs  04/09/14 1646  AST 15  ALT 16  ALKPHOS 69  BILITOT <0.2  PROT 7.4  ALBUMIN 3.9   No results for input(s): LIPASE, AMYLASE in the last 8760 hours. No results for input(s): AMMONIA in the last 8760 hours. CBC:  Recent Labs  03/27/14 1621 07/17/14 0904  WBC 9.9 8.8  NEUTROABS  --  5.3  HGB 13.1  --   HCT 43.4 40.2  MCV 86.3 82  PLT 259  --    Lipid Panel:  Recent Labs  08/20/14 1425  CHOL 162  HDL 85  LDLCALC 62  TRIG 73  CHOLHDL 1.9   TSH: No results for input(s): TSH in the last 8760 hours. A1C: No results found for: HGBA1C   Assessment/Plan 1. Chronic obstructive pulmonary disease,  unspecified COPD, unspecified chronic bronchitis type -remains stable and controlled when taking medication. conts on Spiriva and brovana    2. Essential hypertension, benign Elevated today. To cont current medication, will have pts daughter take blood pressure and record. To bring to follow up visit in 2 weeks   3. Exocrine pancreatic insufficiency (HCC) Remains stable on creon   4. Dementia, without behavioral disturbance Out of medication, will titration aricept and namenda back up to full dose.  5. Hyperlipidemia -conts on zocor, LDL at goal in July - Comprehensive metabolic panel  To follow up blood pressure in 2 week   Donnell Beauchamp K. Harle Battiest  [facility] [phone] 8 am - 5 pm) [phone] (after hours)

## 2015-02-23 NOTE — Patient Instructions (Signed)
To take blood pressure 1-2 hours after medication after she has been sitting or ~ 10 mins  Record and bring to next visit Take blood pressure 3-4 times a week  Follow up in 2 weeks for blood pressure check

## 2015-02-24 LAB — COMPREHENSIVE METABOLIC PANEL
ALT: 11 IU/L (ref 0–32)
AST: 13 IU/L (ref 0–40)
Albumin/Globulin Ratio: 1.1 (ref 1.1–2.5)
Albumin: 4 g/dL (ref 3.5–4.8)
Alkaline Phosphatase: 90 IU/L (ref 39–117)
BUN/Creatinine Ratio: 20 (ref 11–26)
BUN: 22 mg/dL (ref 8–27)
Bilirubin Total: <0.2 mg/dL (ref 0.0–1.2)
CALCIUM: 9.6 mg/dL (ref 8.7–10.3)
CO2: 26 mmol/L (ref 18–29)
Chloride: 103 mmol/L (ref 96–106)
Creatinine, Ser: 1.12 mg/dL — ABNORMAL HIGH (ref 0.57–1.00)
GFR, EST AFRICAN AMERICAN: 56 mL/min/{1.73_m2} — AB (ref >59)
GFR, EST NON AFRICAN AMERICAN: 49 mL/min/{1.73_m2} — AB (ref >59)
GLUCOSE: 85 mg/dL (ref 65–99)
Globulin, Total: 3.5 g/dL (ref 1.5–4.5)
Potassium: 4.9 mmol/L (ref 3.5–5.2)
Sodium: 143 mmol/L (ref 134–144)
TOTAL PROTEIN: 7.5 g/dL (ref 6.0–8.5)

## 2015-03-11 ENCOUNTER — Ambulatory Visit: Payer: Medicare Other | Admitting: Nurse Practitioner

## 2015-03-18 ENCOUNTER — Ambulatory Visit: Payer: Medicare Other | Admitting: Nurse Practitioner

## 2015-03-23 ENCOUNTER — Ambulatory Visit (INDEPENDENT_AMBULATORY_CARE_PROVIDER_SITE_OTHER): Payer: Medicare Other | Admitting: Nurse Practitioner

## 2015-03-23 ENCOUNTER — Encounter: Payer: Self-pay | Admitting: Nurse Practitioner

## 2015-03-23 VITALS — BP 141/100 | HR 93 | Temp 98.1°F | Resp 18 | Ht 60.0 in | Wt 156.8 lb

## 2015-03-23 DIAGNOSIS — I10 Essential (primary) hypertension: Secondary | ICD-10-CM

## 2015-03-23 DIAGNOSIS — E785 Hyperlipidemia, unspecified: Secondary | ICD-10-CM

## 2015-03-23 MED ORDER — CARVEDILOL 25 MG PO TABS: 25.0000 mg | ORAL_TABLET | Freq: Two times a day (BID) | ORAL | Status: DC

## 2015-03-23 NOTE — Progress Notes (Signed)
Patient ID: Carol Monroe, female   DOB: [birth year], 75 y.o.   MRN: [id number]    PCP: Lauree Chandler, NP  Advanced Directive information Does patient have an advance directive?: No, Would patient like information on creating an advanced directive?: No - patient declined information  No Known Allergies  Chief Complaint  Patient presents with  . Follow-up    2 Week follow up on BP     HPI: Patient is a 75 y.o. female seen in the office today to follow up blood pressure. Daughter reports blood pressure has been high. Pt is off coreg CR 40 mg, now taking generic coreg 6.25 mg BID, also taking lisinopril 40 mg, and chlorthalidone.  Daughter reports she has given her coreg 6.25 mg 2 tablets at once which helped bring her blood pressure down from 200s/100s into the 100s. Pulse remaining in the 80s-90s.  No headaches, blurred vision, shortness of breath or chest pains.    Review of Systems:  Review of Systems  Constitutional: Negative for fever and chills.  Respiratory: Negative for cough and shortness of breath.   Cardiovascular: Negative for chest pain, palpitations and leg swelling.  Skin: Negative for rash.  Neurological: Negative for dizziness, weakness and headaches.  Hematological: Does not bruise/bleed easily.  Psychiatric/Behavioral: The patient is not nervous/anxious.        Dementia    Past Medical History  Diagnosis Date  . CHF (congestive heart failure) ([place])   . COPD (chronic obstructive pulmonary disease) ([place])   . Stroke ([place])   . Hypertension   . Heart attack ([place])   . Murmur, heart   . Dementia    Past Surgical History  Procedure Laterality Date  . Abdominal hysterectomy      Partial    Social History:   reports that she quit smoking about 9 years ago. Her smoking use included Cigarettes. She has a 80 pack-year smoking history. She has never used smokeless tobacco. She reports that she does not drink alcohol or use illicit drugs.  Family History    Problem Relation Age of Onset  . Heart disease Mother   . Diabetes Mother   . Diabetes Father   . Breast cancer Daughter     Medications: Patient's Medications  New Prescriptions   No medications on file  Previous Medications   ALBUTEROL (PROVENTIL HFA;VENTOLIN HFA) 108 (90 BASE) MCG/ACT INHALER    Inhale 2 puffs into the lungs every 6 (six) hours as needed for wheezing or shortness of breath.   ARFORMOTEROL (BROVANA) 15 MCG/2ML NEBU    15 mcg/2L inhalation via neb twice daily   CARVEDILOL (COREG) 6.25 MG TABLET    Take 1 tablet (6.25 mg total) by mouth 2 (two) times daily with a meal.   CHLORTHALIDONE (HYGROTON) 25 MG TABLET    TAKE 1 TABLET (25 MG TOTAL) BY MOUTH EVERY MORNING.   CREON 24000 UNITS CPEP    TAKE ONE CAPSULE BY MOUTH 3 TIMES A DAY WITH MEALS   DONEPEZIL (ARICEPT) 10 MG TABLET    1/2 tablet for 1 week then 1 tablet daily   KLOR-CON M20 20 MEQ TABLET    TAKE 1 TABLET EVERY DAY ON MON,WED,FRI AND SAT   LISINOPRIL (PRINIVIL,ZESTRIL) 40 MG TABLET    TAKE 1 TABLET (40 MG TOTAL) BY MOUTH DAILY.   MEMANTINE HCL-DONEPEZIL HCL (NAMZARIC) 28-10 MG CP24    Take 1 tablet by mouth daily. Take one tablet by mouth once daily to preserve memory  SIMVASTATIN (ZOCOR) 20 MG TABLET    TAKE ONE TABLET BY MOUTH ONCE DAILY IN THE EVENING   SPIRIVA HANDIHALER 18 MCG INHALATION CAPSULE    PLACE 1 CAPSULE (18 MCG TOTAL) INTO INHALER AND INHALE DAILY.   TDAP (BOOSTRIX) 5-2.5-18.5 LF-MCG/0.5 INJECTION    Inject 0.5 mLs into the muscle once.   ZOSTER VACCINE LIVE, PF, (ZOSTAVAX) 16109 UNT/0.65ML INJECTION    Inject 19,400 Units into the skin once.  Modified Medications   No medications on file  Discontinued Medications   No medications on file     Physical Exam:  Filed Vitals:   03/23/15 1321  BP: 141/100  Pulse: 93  Temp: 98.1 F (36.7 C)  TempSrc: Oral  Resp: 18  Height: 5' (1.524 m)  Weight: 156 lb 12.8 oz (71.124 kg)  SpO2: 97%   Body mass index is 30.62 kg/(m^2).  Physical  Exam  Constitutional: She appears well-developed and well-nourished.  HENT:  Mouth/Throat: Oropharynx is clear and moist. No oropharyngeal exudate.  Neck: Normal range of motion. Neck supple.  Cardiovascular: Normal rate, regular rhythm and normal heart sounds.   Pulmonary/Chest: Effort normal. She has decreased breath sounds.  Abdominal: Soft. Bowel sounds are normal.  Musculoskeletal: She exhibits no edema.  Neurological: She is alert.  Oriented to person and place only  Skin: Skin is warm and dry.  Psychiatric: She has a normal mood and affect.    Labs reviewed: Basic Metabolic Panel:  Recent Labs  04/09/14 1646 07/17/14 0904 02/23/15 1618  NA 145* 142 143  K 4.3 4.5 4.9  CL 105 101 103  CO2 24 28 26   GLUCOSE 86 104* 85  BUN 26 16 22   CREATININE 1.21* 1.02* 1.12*  CALCIUM 9.6 10.0 9.6   Liver Function Tests:  Recent Labs  04/09/14 1646 02/23/15 1618  AST 15 13  ALT 16 11  ALKPHOS 69 90  BILITOT <0.2 <0.2  PROT 7.4 7.5  ALBUMIN 3.9 4.0   No results for input(s): LIPASE, AMYLASE in the last 8760 hours. No results for input(s): AMMONIA in the last 8760 hours. CBC:  Recent Labs  03/27/14 1621 07/17/14 0904  WBC 9.9 8.8  NEUTROABS  --  5.3  HGB 13.1  --   HCT 43.4 40.2  MCV 86.3 82  PLT 259  --    Lipid Panel:  Recent Labs  08/20/14 1425  CHOL 162  HDL 85  LDLCALC 62  TRIG 73  CHOLHDL 1.9   TSH: No results for input(s): TSH in the last 8760 hours. A1C: No results found for: HGBA1C   Assessment/Plan 1. Essential hypertension -elevated, will increase carvedilol to 25 mg BID, as pts daughter has already attempted to increase to 12.5 which was not effected in reduction of blood pressure.  Pt already on low sodium diet. To cont.  -to cont chlorthalidone and lisinopril - carvedilol (COREG) 25 MG tablet; Take 1 tablet (25 mg total) by mouth 2 (two) times daily with a meal.  Dispense: 60 tablet; Refill: 0 - CBC with Differential; Future  2.  Hyperlipidemia -cont heart healthy diet, conts on zocor 20 mg daily -will follow up lipid panel before next visit  - Comprehensive metabolic panel; Future - Lipid panel; Future  Follow up in 5 months for EV with fasting blood work prior to visit.   Carlos American. Harle Battiest  [facility] [phone] 8 am - 5 pm) [phone] (after hours)

## 2015-03-23 NOTE — Patient Instructions (Signed)
Will Increase carvedilol to 25 mg twice daily -- start by taking carvedilol 25 mg 1/2 tablet for 1 week- make sure pulse is staying above 60. Then increase to 25 mg twice daily, with food  Goal is blood pressure to be less than 140/90 Pulse 60-100  Notify office and make follow up appt if not at goal within 2 weeks.   Follow up in 3 months for routine follow up

## 2015-04-15 ENCOUNTER — Other Ambulatory Visit: Payer: Self-pay | Admitting: Nurse Practitioner

## 2015-04-17 ENCOUNTER — Other Ambulatory Visit: Payer: Self-pay | Admitting: Nurse Practitioner

## 2015-04-22 ENCOUNTER — Other Ambulatory Visit: Payer: Self-pay | Admitting: Nurse Practitioner

## 2015-05-17 ENCOUNTER — Other Ambulatory Visit: Payer: Self-pay | Admitting: Nurse Practitioner

## 2015-06-05 ENCOUNTER — Other Ambulatory Visit: Payer: Self-pay | Admitting: Nurse Practitioner

## 2015-06-19 ENCOUNTER — Other Ambulatory Visit: Payer: Self-pay | Admitting: Nurse Practitioner

## 2015-07-06 ENCOUNTER — Ambulatory Visit: Payer: Medicare Other | Admitting: Nurse Practitioner

## 2015-07-23 ENCOUNTER — Other Ambulatory Visit: Payer: Self-pay

## 2015-07-23 DIAGNOSIS — J449 Chronic obstructive pulmonary disease, unspecified: Secondary | ICD-10-CM

## 2015-07-23 MED ORDER — ALBUTEROL SULFATE HFA 108 (90 BASE) MCG/ACT IN AERS: 2.0000 | INHALATION_SPRAY | Freq: Four times a day (QID) | RESPIRATORY_TRACT | Status: AC | PRN

## 2015-07-26 ENCOUNTER — Other Ambulatory Visit: Payer: Self-pay

## 2015-07-26 MED ORDER — TIOTROPIUM BROMIDE MONOHYDRATE 18 MCG IN CAPS: ORAL_CAPSULE | RESPIRATORY_TRACT | Status: DC

## 2015-07-26 NOTE — Telephone Encounter (Signed)
Refill request from [facility]

## 2015-08-02 ENCOUNTER — Other Ambulatory Visit: Payer: Self-pay

## 2015-08-02 MED ORDER — TIOTROPIUM BROMIDE MONOHYDRATE 18 MCG IN CAPS: ORAL_CAPSULE | RESPIRATORY_TRACT | Status: DC

## 2015-08-02 NOTE — Telephone Encounter (Signed)
Patient requested a 90 day refill. Last Rx was called in was for #30 with 5 RF. New Rx is for #90 with 1RF

## 2015-08-17 NOTE — Addendum Note (Signed)
Addended by: Moshe Cipro, Sherard Sutch A on: 08/17/2015 03:50 PM   Modules accepted: Orders

## 2015-08-23 ENCOUNTER — Other Ambulatory Visit: Payer: Medicare Other

## 2015-08-26 ENCOUNTER — Encounter: Payer: Self-pay | Admitting: Nurse Practitioner

## 2015-08-26 ENCOUNTER — Ambulatory Visit (INDEPENDENT_AMBULATORY_CARE_PROVIDER_SITE_OTHER): Payer: Medicare Other | Admitting: Nurse Practitioner

## 2015-08-26 VITALS — BP 134/82 | HR 84 | Temp 97.5°F | Resp 18 | Ht 60.0 in | Wt 156.2 lb

## 2015-08-26 DIAGNOSIS — E785 Hyperlipidemia, unspecified: Secondary | ICD-10-CM

## 2015-08-26 DIAGNOSIS — E2839 Other primary ovarian failure: Secondary | ICD-10-CM | POA: Diagnosis not present

## 2015-08-26 DIAGNOSIS — K068 Other specified disorders of gingiva and edentulous alveolar ridge: Secondary | ICD-10-CM

## 2015-08-26 DIAGNOSIS — I10 Essential (primary) hypertension: Secondary | ICD-10-CM | POA: Diagnosis not present

## 2015-08-26 DIAGNOSIS — Z0000 Encounter for general adult medical examination without abnormal findings: Secondary | ICD-10-CM | POA: Diagnosis not present

## 2015-08-26 DIAGNOSIS — J449 Chronic obstructive pulmonary disease, unspecified: Secondary | ICD-10-CM

## 2015-08-26 DIAGNOSIS — F0151 Vascular dementia with behavioral disturbance: Secondary | ICD-10-CM

## 2015-08-26 DIAGNOSIS — F01518 Vascular dementia, unspecified severity, with other behavioral disturbance: Secondary | ICD-10-CM

## 2015-08-26 DIAGNOSIS — Z1231 Encounter for screening mammogram for malignant neoplasm of breast: Secondary | ICD-10-CM | POA: Diagnosis not present

## 2015-08-26 LAB — CBC WITH DIFFERENTIAL/PLATELET
BASOS ABS: 0 {cells}/uL (ref 0–200)
Basophils Relative: 0 %
EOS PCT: 1 %
Eosinophils Absolute: 69 cells/uL (ref 15–500)
HCT: 39.7 % (ref 35.0–45.0)
Hemoglobin: 12.2 g/dL (ref 11.7–15.5)
Lymphocytes Relative: 32 %
Lymphs Abs: 2208 cells/uL (ref 850–3900)
MCH: 25.6 pg — AB (ref 27.0–33.0)
MCHC: 30.7 g/dL — AB (ref 32.0–36.0)
MCV: 83.2 fL (ref 80.0–100.0)
MONOS PCT: 8 %
MPV: 10.1 fL (ref 7.5–12.5)
Monocytes Absolute: 552 cells/uL (ref 200–950)
NEUTROS ABS: 4071 {cells}/uL (ref 1500–7800)
NEUTROS PCT: 59 %
PLATELETS: 285 10*3/uL (ref 140–400)
RBC: 4.77 MIL/uL (ref 3.80–5.10)
RDW: 17.2 % — ABNORMAL HIGH (ref 11.0–15.0)
WBC: 6.9 10*3/uL (ref 3.8–10.8)

## 2015-08-26 LAB — COMPLETE METABOLIC PANEL WITH GFR
ALBUMIN: 4 g/dL (ref 3.6–5.1)
ALK PHOS: 56 U/L (ref 33–130)
ALT: 9 U/L (ref 6–29)
AST: 15 U/L (ref 10–35)
BILIRUBIN TOTAL: 0.5 mg/dL (ref 0.2–1.2)
BUN: 18 mg/dL (ref 7–25)
CALCIUM: 9.7 mg/dL (ref 8.6–10.4)
CO2: 27 mmol/L (ref 20–31)
Chloride: 103 mmol/L (ref 98–110)
Creat: 1.06 mg/dL — ABNORMAL HIGH (ref 0.60–0.93)
GFR, EST AFRICAN AMERICAN: 60 mL/min (ref >=60)
GFR, EST NON AFRICAN AMERICAN: 52 mL/min — AB (ref >=60)
GLUCOSE: 82 mg/dL (ref 65–99)
Potassium: 4.2 mmol/L (ref 3.5–5.3)
SODIUM: 141 mmol/L (ref 135–146)
TOTAL PROTEIN: 7.9 g/dL (ref 6.1–8.1)

## 2015-08-26 LAB — LIPID PANEL
CHOLESTEROL: 162 mg/dL (ref 125–200)
HDL: 80 mg/dL (ref >=46)
LDL Cholesterol: 65 mg/dL (ref <130)
Total CHOL/HDL Ratio: 2 Ratio (ref <=5.0)
Triglycerides: 86 mg/dL (ref <150)
VLDL: 17 mg/dL (ref <30)

## 2015-08-26 MED ORDER — MEMANTINE HCL-DONEPEZIL HCL ER 28-10 MG PO CP24: 1.0000 | ORAL_CAPSULE | Freq: Every day | ORAL | 1 refills | Status: DC

## 2015-08-26 NOTE — Progress Notes (Signed)
Patient Care Team: Lauree Chandler, NP as PCP - General (Nurse Practitioner)  Extended Emergency Contact Information Primary Emergency Contact: Reeder,Donna Address: [address]          [place], [place] [address] [place] Mobile Phone: [phone] Relation: Daughter No Known Allergies Code Status: FULL Goals of Care: Advanced Directive information Advanced Directives 08/26/2015  Does patient have an advance directive? No  Does patient want to make changes to advanced directive? -  Would patient like information on creating an advanced directive? -     Chief Complaint  Patient presents with  . Medical Management of Chronic Issues    Complete physical. Needs form completed for adult daycare. Failed clock drawing.   . Other    Granddaughter, Darnella, in room with patient.     HPI: Patient is a 75 y.o. female seen in today for an annual wellness exam.  Pt has been doing well. No major illness or hospitalizations in the last year.   Depression screen [facility] 2/9 08/26/2015 03/23/2015 08/20/2014 08/20/2014 07/23/2014  Decreased Interest 0 0 0 0 0  Down, Depressed, Hopeless 0 0 0 0 0  PHQ - 2 Score 0 0 0 0 0    Fall Risk  08/26/2015 03/23/2015 02/23/2015 08/20/2014 07/23/2014  Falls in the past year? No No No No No   MMSE - Mini Mental State Exam 08/26/2015 08/20/2014 09/11/2013 05/22/2012  Orientation to time 2 2 2  0  Orientation to Place 0 0 0 3  Registration 3 3 3 3   Attention/ Calculation 3 4 5 2   Recall 1 0 0 1  Language- name 2 objects 2 2 2 2   Language- repeat 1 1 1 1   Language- follow 3 step command 3 3 3 3   Language- read & follow direction 1 1 1 1   Write a sentence 0 1 1 0  Copy design 0 0 0 0  Total score 16 17 18 16      Health Maintenance  Topic Date Due  . TETANUS/TDAP  01/17/1961  . ZOSTAVAX  01/17/2002  . DEXA SCAN  01/18/2007  . PNA vac Low Risk Adult (2 of 2 - PPSV23) 08/20/2015  . INFLUENZA VACCINE  08/24/2015  . MAMMOGRAM  10/02/2015  .  COLONOSCOPY  01/29/2017    Urinary incontinence? Urine and stool  Functional Status Survey: Is the patient deaf or have difficulty hearing?: No Does the patient have difficulty seeing, even when wearing glasses/contacts?: No Does the patient have difficulty concentrating, remembering, or making decisions?: Yes Does the patient have difficulty walking or climbing stairs?: No Does the patient have difficulty dressing or bathing?: Yes (needing help bathing, sometimes does not make it to the bathroom before having an incontient episode ) Does the patient have difficulty doing errands alone such as visiting a doctor's office or shopping?: Yes Exercise? Current Exercise Habits: The patient does not participate in regular exercise at present Exercise limited by: neurologic condition(s) Diet? No special diet  No exam data present Dentition: poor dentition, does not see dentist routinely Pain: none Lives with daughter, attends adult day program  Past Medical History:  Diagnosis Date  . CHF (congestive heart failure) ([place])   . COPD (chronic obstructive pulmonary disease) ([place])   . Dementia   . Heart attack ([place])   . Hypertension   . Murmur, heart   . Stroke [facility])     Past Surgical History:  Procedure Laterality Date  . ABDOMINAL HYSTERECTOMY     Partial  Social History   Social History  . Marital status: Single    Spouse name: N/A  . Number of children: 4  . Years of education: N/A   Occupational History  . retired    Social History Main Topics  . Smoking status: Former Smoker    Packs/day: 2.00    Years: 40.00    Types: Cigarettes    Quit date: 01/24/2006  . Smokeless tobacco: Never Used  . Alcohol use No  . Drug use: No  . Sexual activity: No   Other Topics Concern  . None   Social History Narrative  . None    Family History  Problem Relation Age of Onset  . Heart disease Mother   . Diabetes Mother   . Diabetes Father   . Breast cancer Daughter      Review of Systems:  Review of Systems  Constitutional: Negative for chills and fever.  Eyes: Negative.   Respiratory: Negative for cough and shortness of breath.   Cardiovascular: Negative for chest pain, palpitations and leg swelling.  Skin: Negative for rash.  Neurological: Negative for dizziness, weakness and headaches.  Hematological: Does not bruise/bleed easily.  Psychiatric/Behavioral: The patient is not nervous/anxious.        Dementia       Medication List       Accurate as of 08/26/15  2:14 PM. Always use your most recent med list.          albuterol 108 (90 Base) MCG/ACT inhaler Commonly known as:  PROVENTIL HFA;VENTOLIN HFA Inhale 2 puffs into the lungs every 6 (six) hours as needed for wheezing or shortness of breath.   arformoterol 15 MCG/2ML Nebu Commonly known as:  BROVANA 15 mcg/2L inhalation via neb twice daily   carvedilol 25 MG tablet Commonly known as:  COREG TAKE 1 TABLET BY MOUTH TWICE A DAY WITH MEALS   chlorthalidone 25 MG tablet Commonly known as:  HYGROTON TAKE 1 TABLET (25 MG TOTAL) BY MOUTH EVERY MORNING.   CREON 24000 units Cpep Generic drug:  Pancrelipase (Lip-Prot-Amyl) TAKE ONE CAPSULE BY MOUTH 3 TIMES A DAY WITH MEALS   KLOR-CON M20 20 MEQ tablet Generic drug:  potassium chloride SA TAKE 1 TABLET EVERY DAY ON MON,WED,FRI AND SAT   lisinopril 40 MG tablet Commonly known as:  PRINIVIL,ZESTRIL TAKE 1 TABLET (40 MG TOTAL) BY MOUTH DAILY.   Memantine HCl-Donepezil HCl 28-10 MG Cp24 Commonly known as:  NAMZARIC Take 1 tablet by mouth daily. Take one tablet by mouth once daily to preserve memory   simvastatin 20 MG tablet Commonly known as:  ZOCOR TAKE ONE TABLET BY MOUTH ONCE DAILY IN THE EVENING   tiotropium 18 MCG inhalation capsule Commonly known as:  SPIRIVA HANDIHALER PLACE 1 CAPSULE (18 MCG TOTAL) INTO INHALER AND INHALE DAILY.         Physical Exam: Vitals:   08/26/15 1342  BP: 134/82  Pulse: 84  Resp: 18   Temp: 97.5 F (36.4 C)  TempSrc: Oral  SpO2: 94%  Weight: 156 lb 3.2 oz (70.9 kg)  Height: 5' (1.524 m)   Body mass index is 30.51 kg/m. Physical Exam  Constitutional: She appears well-developed and well-nourished.  HENT:  Head: Normocephalic and atraumatic.  Right Ear: External ear normal.  Left Ear: External ear normal.  Nose: Nose normal.  Mouth/Throat: Oropharynx is clear and moist. No oropharyngeal exudate.  Small Lesion noted to bottom right gum  Eyes: Conjunctivae and EOM are normal. Pupils are equal,  round, and reactive to light.  Neck: Normal range of motion. Neck supple.  Cardiovascular: Normal rate, regular rhythm and normal heart sounds.   Pulmonary/Chest: Effort normal. She has decreased breath sounds.  Abdominal: Soft. Bowel sounds are normal.  Musculoskeletal: She exhibits no edema.  Neurological: She is alert. She has normal reflexes.  Oriented to person and place only  Skin: Skin is warm and dry.  Psychiatric: She has a normal mood and affect.    Labs reviewed: Basic Metabolic Panel:  Recent Labs  02/23/15 1618  NA 143  K 4.9  CL 103  CO2 26  GLUCOSE 85  BUN 22  CREATININE 1.12*  CALCIUM 9.6   Liver Function Tests:  Recent Labs  02/23/15 1618  AST 13  ALT 11  ALKPHOS 90  BILITOT <0.2  PROT 7.5  ALBUMIN 4.0   No results for input(s): LIPASE, AMYLASE in the last 8760 hours. No results for input(s): AMMONIA in the last 8760 hours. CBC: No results for input(s): WBC, NEUTROABS, HGB, HCT, MCV, PLT in the last 8760 hours. Lipid Panel: No results for input(s): CHOL, HDL, LDLCALC, TRIG, CHOLHDL, LDLDIRECT in the last 8760 hours. No results found for: HGBA1C  Procedures: No results found.  Assessment/Plan 1. Medicare annual wellness visit, subsequent Pt is doing well, needing routine dental work done plus evaluation of lesion. Weight has been stable, Heart healthy diet recommended Needs dexa scan and mammogram MMSE 16/30 with ongoing  memory loss, living at home with daughter -advanced directive discussed and form given to granddaughter -30 mins of exercise 5 days a week   2. Vascular dementia with behavioral disturbance -MMSE 16/30, worse than last year with slow decline which is anticipated. conts at day program while daughter works, form completed today.  - Memantine HCl-Donepezil HCl (NAMZARIC) 28-10 MG CP24; Take 1 tablet by mouth daily. Take one tablet by mouth once daily to preserve memory  Dispense: 90 capsule; Refill: 1  3. Hyperlipidemia -no recent labs, will have done today -heart healthy diet with 30 mins exercise 5 days as week  - COMPLETE METABOLIC PANEL WITH GFR - Lipid panel  4. Essential hypertension -blood pressure stable, conts on carvedilol and lisinopril  - COMPLETE METABOLIC PANEL WITH GFR - CBC with Differential/Platelets  5. Chronic obstructive pulmonary disease, unspecified COPD, unspecified chronic bronchitis type No recent excerebration, conts on spiriva, brovana and albuterol PRN  6. Gum lesion -pt has appt scheduled to see dentist per granddaughter for further evaluation and treatment   7. Estrogen deficiency - DG Bone Density; Future  8. Encounter for screening mammogram for breast cancer - MM DIGITAL SCREENING BILATERAL; Future   Follow up in 3 months with Dr Eulas Post or Dr Mariea Clonts for routine follow up

## 2015-08-26 NOTE — Patient Instructions (Addendum)
To make sure you go to the Dentist and have them look at lesion on gum Will get lab work today  Follow up in 3 months with Dr Mariea Clonts or Dr Eulas Post  Exercise 30 mins/5 days a week   Heart-Healthy Eating Plan Many factors influence your heart health, including eating and exercise habits. Heart (coronary) risk increases with abnormal blood fat (lipid) levels. Heart-healthy meal planning includes limiting unhealthy fats, increasing healthy fats, and making other small dietary changes. This includes maintaining a healthy body weight to help keep lipid levels within a normal range.  WHAT TYPES OF FAT SHOULD I CHOOSE?  Choose healthy fats more often. Choose monounsaturated and polyunsaturated fats, such as olive oil and canola oil, flaxseeds, walnuts, almonds, and seeds.  Eat more omega-3 fats. Good choices include salmon, mackerel, sardines, tuna, flaxseed oil, and ground flaxseeds. Aim to eat fish at least two times each week.  Limit saturated fats. Saturated fats are primarily found in animal products, such as meats, butter, and cream. Plant sources of saturated fats include palm oil, palm kernel oil, and coconut oil.  Avoid foods with partially hydrogenated oils in them. These contain trans fats. Examples of foods that contain trans fats are stick margarine, some tub margarines, cookies, crackers, and other baked goods. WHAT GENERAL GUIDELINES DO I NEED TO FOLLOW?  Check food labels carefully to identify foods with trans fats or high amounts of saturated fat.  Fill one half of your plate with vegetables and green salads. Eat 4-5 servings of vegetables per day. A serving of vegetables equals 1 cup of raw leafy vegetables,  cup of raw or cooked cut-up vegetables, or  cup of vegetable juice.  Fill one fourth of your plate with whole grains. Look for the word "whole" as the first word in the ingredient list.  Fill one fourth of your plate with lean protein foods.  Eat 4-5 servings of fruit per  day. A serving of fruit equals one medium whole fruit,  cup of dried fruit,  cup of fresh, frozen, or canned fruit, or  cup of 100% fruit juice.  Eat more foods that contain soluble fiber. Examples of foods that contain this type of fiber are apples, broccoli, carrots, beans, peas, and barley. Aim to get 20-30 g of fiber per day.  Eat more home-cooked food and less restaurant, buffet, and fast food.  Limit or avoid alcohol.  Limit foods that are high in starch and sugar.  Avoid fried foods.  Cook foods by using methods other than frying. Baking, boiling, grilling, and broiling are all great options. Other fat-reducing suggestions include:  Removing the skin from poultry.  Removing all visible fats from meats.  Skimming the fat off of stews, soups, and gravies before serving them.  Steaming vegetables in water or broth.  Lose weight if you are overweight. Losing just 5-10% of your initial body weight can help your overall health and prevent diseases such as diabetes and heart disease.  Increase your consumption of nuts, legumes, and seeds to 4-5 servings per week. One serving of dried beans or legumes equals  cup after being cooked, one serving of nuts equals 1 ounces, and one serving of seeds equals  ounce or 1 tablespoon.  You may need to monitor your salt (sodium) intake, especially if you have high blood pressure. Talk with your health care provider or dietitian to get more information about reducing sodium. WHAT FOODS CAN I EAT? Grains Breads, including [place], white, pita, wheat, raisin,  rye, oatmeal, and [place]. Tortillas that are neither fried nor made with lard or trans fat. Low-fat rolls, including hotdog and hamburger buns and English muffins. Biscuits. Muffins. Waffles. Pancakes. Light popcorn. Whole-grain cereals. Flatbread. Melba toast. Pretzels. Breadsticks. Rusks. Low-fat snacks and crackers, including oyster, saltine, matzo, graham, animal, and rye. Rice and pasta,  including brown rice and those that are made with whole wheat. Vegetables All vegetables. Fruits All fruits, but limit coconut. Meats and Other Protein Sources Lean, well-trimmed beef, veal, pork, and lamb. Chicken and [place] without skin. All fish and shellfish. Wild duck, rabbit, pheasant, and venison. Egg whites or low-cholesterol egg substitutes. Dried beans, peas, lentils, and tofu.Seeds and most nuts. Dairy Low-fat or nonfat cheeses, including ricotta, string, and mozzarella. Skim or 1% milk that is liquid, powdered, or evaporated. Buttermilk that is made with low-fat milk. Nonfat or low-fat yogurt. Beverages Mineral water. Diet carbonated beverages. Sweets and Desserts Sherbets and fruit ices. Honey, jam, marmalade, jelly, and syrups. Meringues and gelatins. Pure sugar candy, such as hard candy, jelly beans, gumdrops, mints, marshmallows, and small amounts of dark chocolate. [employer] Eat all sweets and desserts in moderation. Fats and Oils Nonhydrogenated (trans-free) margarines. Vegetable oils, including soybean, sesame, sunflower, olive, peanut, safflower, corn, canola, and cottonseed. Salad dressings or mayonnaise that are made with a vegetable oil. Limit added fats and oils that you use for cooking, baking, salads, and as spreads. Other Cocoa powder. Coffee and tea. All seasonings and condiments. The items listed above may not be a complete list of recommended foods or beverages. Contact your dietitian for more options. WHAT FOODS ARE NOT RECOMMENDED? Grains Breads that are made with saturated or trans fats, oils, or whole milk. Croissants. Butter rolls. Cheese breads. Sweet rolls. Donuts. Buttered popcorn. Chow mein noodles. High-fat crackers, such as cheese or butter crackers. Meats and Other Protein Sources Fatty meats, such as hotdogs, short ribs, sausage, spareribs, bacon, ribeye roast or steak, and mutton. High-fat deli meats, such as salami and bologna. Caviar.  Domestic duck and goose. Organ meats, such as kidney, liver, sweetbreads, brains, gizzard, chitterlings, and heart. Dairy Cream, sour cream, cream cheese, and creamed cottage cheese. Whole milk cheeses, including blue (bleu), Monte[place], [place], [place], [place], [place], [place], [place], [place], and [place]. Whole or 2% milk that is liquid, evaporated, or condensed. Whole buttermilk. Cream sauce or high-fat cheese sauce. Yogurt that is made from whole milk. Beverages Regular sodas and drinks with added sugar. Sweets and Desserts Frosting. Pudding. Cookies. Cakes other than angel food cake. Candy that has milk chocolate or white chocolate, hydrogenated fat, butter, coconut, or unknown ingredients. Buttered syrups. Full-fat ice cream or ice cream drinks. Fats and Oils Gravy that has suet, meat fat, or shortening. Cocoa butter, hydrogenated oils, palm oil, coconut oil, palm kernel oil. These can often be found in baked products, candy, fried foods, nondairy creamers, and whipped toppings. Solid fats and shortenings, including bacon fat, salt pork, lard, and butter. Nondairy cream substitutes, such as coffee creamers and sour cream substitutes. Salad dressings that are made of unknown oils, cheese, or sour cream. The items listed above may not be a complete list of foods and beverages to avoid. Contact your dietitian for more information.   This information is not intended to replace advice given to you by your health care provider. Make sure you discuss any questions you have with your health care provider.   Document Released: 10/19/2007 Document Revised: 01/30/2014 Document Reviewed: 07/03/2013 Elsevier Interactive Patient Education 2016  [employer].

## 2015-08-30 ENCOUNTER — Telehealth: Payer: Self-pay

## 2015-08-30 NOTE — Telephone Encounter (Signed)
I called patient's daughter to see about scheduling a 3 month routine follow up appointment with either Dr. Mariea Clonts or Dr. Eulas Post at [facility] request. Patient was seen in the office on 08/26/15 but did not schedule a routine follow up when she left.

## 2015-09-09 ENCOUNTER — Other Ambulatory Visit: Payer: Self-pay | Admitting: Nurse Practitioner

## 2015-09-12 ENCOUNTER — Other Ambulatory Visit: Payer: Self-pay | Admitting: Nurse Practitioner

## 2015-10-11 ENCOUNTER — Other Ambulatory Visit: Payer: Self-pay | Admitting: *Deleted

## 2015-10-11 MED ORDER — CARVEDILOL 25 MG PO TABS: 25.0000 mg | ORAL_TABLET | Freq: Two times a day (BID) | ORAL | 1 refills | Status: DC

## 2015-10-11 NOTE — Telephone Encounter (Signed)
[facility] 

## 2015-10-18 ENCOUNTER — Other Ambulatory Visit: Payer: Self-pay | Admitting: Nurse Practitioner

## 2015-11-09 ENCOUNTER — Other Ambulatory Visit: Payer: Self-pay | Admitting: Nurse Practitioner

## 2016-01-14 ENCOUNTER — Other Ambulatory Visit: Payer: Self-pay | Admitting: Nurse Practitioner

## 2016-01-27 ENCOUNTER — Telehealth: Payer: Self-pay

## 2016-01-27 NOTE — Telephone Encounter (Signed)
I called patient's daughter to see about setting up a follow up appointment for patient. I left a message for patient to office to set up appointment.

## 2016-01-27 NOTE — Telephone Encounter (Signed)
-----   Message from Lauree Chandler, NP sent at 01/25/2016  9:31 AM EST ----- Pt is over due to follow up, was supposed to follow up with Dr Eulas Post or Dr Mariea Clonts for physician visit  ----- Message ----- From: SYSTEM Sent: 01/25/2016  12:06 AM To: Lauree Chandler, NP

## 2016-02-07 NOTE — Telephone Encounter (Signed)
I spoke with patient's daughter, [name], about scheduling follow up appointment with physician per [name]. [name] scheduled appointment for 02/21/16.

## 2016-02-15 ENCOUNTER — Other Ambulatory Visit: Payer: Self-pay | Admitting: Nurse Practitioner

## 2016-02-16 ENCOUNTER — Other Ambulatory Visit: Payer: Self-pay | Admitting: Nurse Practitioner

## 2016-02-16 DIAGNOSIS — F01518 Vascular dementia, unspecified severity, with other behavioral disturbance: Secondary | ICD-10-CM

## 2016-02-16 DIAGNOSIS — F0151 Vascular dementia with behavioral disturbance: Secondary | ICD-10-CM

## 2016-02-21 ENCOUNTER — Encounter: Payer: Self-pay | Admitting: Internal Medicine

## 2016-02-21 ENCOUNTER — Ambulatory Visit (INDEPENDENT_AMBULATORY_CARE_PROVIDER_SITE_OTHER): Payer: Medicare Other | Admitting: Internal Medicine

## 2016-02-21 VITALS — BP 120/70 | HR 82 | Temp 98.1°F | Wt 164.0 lb

## 2016-02-21 DIAGNOSIS — Z23 Encounter for immunization: Secondary | ICD-10-CM

## 2016-02-21 DIAGNOSIS — F0151 Vascular dementia with behavioral disturbance: Secondary | ICD-10-CM | POA: Diagnosis not present

## 2016-02-21 DIAGNOSIS — I639 Cerebral infarction, unspecified: Secondary | ICD-10-CM | POA: Diagnosis not present

## 2016-02-21 DIAGNOSIS — J449 Chronic obstructive pulmonary disease, unspecified: Secondary | ICD-10-CM

## 2016-02-21 DIAGNOSIS — N3941 Urge incontinence: Secondary | ICD-10-CM | POA: Diagnosis not present

## 2016-02-21 DIAGNOSIS — K8681 Exocrine pancreatic insufficiency: Secondary | ICD-10-CM | POA: Diagnosis not present

## 2016-02-21 DIAGNOSIS — I10 Essential (primary) hypertension: Secondary | ICD-10-CM | POA: Diagnosis not present

## 2016-02-21 DIAGNOSIS — F01518 Vascular dementia, unspecified severity, with other behavioral disturbance: Secondary | ICD-10-CM

## 2016-02-21 DIAGNOSIS — I509 Heart failure, unspecified: Secondary | ICD-10-CM

## 2016-02-21 MED ORDER — LISINOPRIL 40 MG PO TABS: 40.0000 mg | ORAL_TABLET | Freq: Every day | ORAL | 5 refills | Status: DC

## 2016-02-21 NOTE — Progress Notes (Signed)
Location:  [facility] clinic Provider:  Mikel Pyon L. Mariea Clonts, D.O., C.M.D.  Goals of Care:  Advanced Directives 08/26/2015  Does Patient Have a Medical Advance Directive? No  Does patient want to make changes to medical advance directive? -  Would patient like information on creating a medical advance directive? -   Chief Complaint  Patient presents with  . Follow-up    HPI: Patient is a 76 y.o. female seen today for medical management of chronic diseases.    HTN:  bp at goal.    CHF:  Is dyspneic on exertion like coming in from the car--unchanged from baseline.    COPD:  Had a cold when she returned from [place], but it went away on its own.  Urinary incontinence:  tryng to get to restroom in time.  Using briefs also.  Has to go frequently.  On chlorthalidone but needs with CHF.  Memory stable lately.  Using namzaric without a problem.  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  Left hand was weak, but that returned.  Her daughter reminds her to take a bath, helps her with her pills, her daughter picks out her clothes, but she puts them on herself.  16/30 on last mmse in august.  Had uncontrollable bm after having ice cream the night before and also happens with greens sometimes.  Creon has helped her also though.    No falls.  Says she does not sleep well at night, but sleeps late in the am.   No pain.    Past Medical History:  Diagnosis Date  . CHF (congestive heart failure) ([place])   . COPD (chronic obstructive pulmonary disease) ([place])   . Dementia   . Heart attack   . Hypertension   . Murmur, heart   . Stroke [facility])     Past Surgical History:  Procedure Laterality Date  . ABDOMINAL HYSTERECTOMY     Partial     No Known Allergies  Allergies as of 02/21/2016   No Known Allergies     Medication List       Accurate as of 02/21/16  4:27 PM. Always use your most recent med list.          albuterol 108 (90 Base) MCG/ACT inhaler Commonly known as:   PROVENTIL HFA;VENTOLIN HFA Inhale 2 puffs into the lungs every 6 (six) hours as needed for wheezing or shortness of breath.   carvedilol 25 MG tablet Commonly known as:  COREG Take 1 tablet (25 mg total) by mouth 2 (two) times daily with a meal.   chlorthalidone 25 MG tablet Commonly known as:  HYGROTON TAKE 1 TABLET (25 MG TOTAL) BY MOUTH EVERY MORNING.   CREON 24000-76000 units Cpep Generic drug:  Pancrelipase (Lip-Prot-Amyl) TAKE ONE CAPSULE BY MOUTH 3 TIMES A DAY WITH MEALS   KLOR-CON M20 20 MEQ tablet Generic drug:  potassium chloride SA TAKE 1 TABLET EVERY DAY ON MON,WED,FRI AND SAT   lisinopril 40 MG tablet Commonly known as:  PRINIVIL,ZESTRIL TAKE 1 TABLET (40 MG TOTAL) BY MOUTH DAILY.   NAMZARIC 28-10 MG Cp24 Generic drug:  Memantine HCl-Donepezil HCl TAKE 1 CAPSULE BY MOUTH DAILY TO PRESERVE MEMORY   simvastatin 20 MG tablet Commonly known as:  ZOCOR TAKE ONE TABLET BY MOUTH ONCE DAILY IN THE EVENING   SPIRIVA HANDIHALER 18 MCG inhalation capsule Generic drug:  tiotropium PLACE 1 CAPSULE INTO INHALER AND INHALE DAILY       Review of Systems:  Review  of Systems  Constitutional: Negative for chills, fever, malaise/fatigue and weight loss.  HENT: Negative for congestion, hearing loss and sinus pain.   Eyes: Negative for blurred vision.  Respiratory: Positive for shortness of breath. Negative for cough.   Cardiovascular: Negative for chest pain, palpitations and leg swelling.  Gastrointestinal: Negative for abdominal pain, blood in stool, constipation, diarrhea and melena.  Genitourinary: Positive for urgency. Negative for dysuria, flank pain, frequency and hematuria.       Incontinence  Musculoskeletal: Negative for falls and joint pain.  Skin: Negative for itching and rash.  Neurological: Negative for dizziness, sensory change, speech change, focal weakness, loss of consciousness and weakness.  Endo/Heme/Allergies: Does not bruise/bleed easily.    Psychiatric/Behavioral: Positive for memory loss. Negative for depression. The patient has insomnia. The patient is not nervous/anxious.        Sleeps late instead of sleeping at night    Health Maintenance  Topic Date Due  . TETANUS/TDAP  01/17/1961  . ZOSTAVAX  01/17/2002  . DEXA SCAN  01/18/2007  . PNA vac Low Risk Adult (2 of 2 - PPSV23) 08/20/2015  . INFLUENZA VACCINE  08/24/2015  . MAMMOGRAM  10/02/2015  . COLONOSCOPY  01/29/2017    Physical Exam: Vitals:   02/21/16 1547  BP: 120/70  Pulse: 82  Temp: 98.1 F (36.7 C)  TempSrc: Oral  SpO2: 95%  Weight: 164 lb (74.4 kg)   Body mass index is 32.03 kg/m. Physical Exam  Constitutional: She appears well-developed and well-nourished. No distress.  Cardiovascular: Normal rate, regular rhythm, normal heart sounds and intact distal pulses.   Pulmonary/Chest: Effort normal and breath sounds normal. No respiratory distress.  Abdominal: Soft. Bowel sounds are normal. She exhibits no distension. There is no tenderness.  Musculoskeletal: Normal range of motion.  Neurological: She is alert.  oriented to person and knows at doctor's office  Skin: Skin is warm and dry.    Labs reviewed: Basic Metabolic Panel:  Recent Labs  02/23/15 1618 08/26/15 1454  NA 143 141  K 4.9 4.2  CL 103 103  CO2 26 27  GLUCOSE 85 82  BUN 22 18  CREATININE 1.12* 1.06*  CALCIUM 9.6 9.7   Liver Function Tests:  Recent Labs  02/23/15 1618 08/26/15 1454  AST 13 15  ALT 11 9  ALKPHOS 90 56  BILITOT <0.2 0.5  PROT 7.5 7.9  ALBUMIN 4.0 4.0   No results for input(s): LIPASE, AMYLASE in the last 8760 hours. No results for input(s): AMMONIA in the last 8760 hours. CBC:  Recent Labs  08/26/15 1454  WBC 6.9  NEUTROABS 4,071  HGB 12.2  HCT 39.7  MCV 83.2  PLT 285   Lipid Panel:  Recent Labs  08/26/15 1454  CHOL 162  HDL 80  LDLCALC 65  TRIG 86  CHOLHDL 2.0   Assessment/Plan 1. Congestive heart failure, unspecified  congestive heart failure chronicity, unspecified congestive heart failure type ([place]) - stable with current therapy, some chronic dyspnea on exertion but her daughter reports no change in symptoms - lisinopril (PRINIVIL,ZESTRIL) 40 MG tablet; Take 1 tablet (40 mg total) by mouth daily.  Dispense: 30 tablet; Refill: 5  2. Essential hypertension, benign - bp well controlled cont same regimen and monitor - lisinopril (PRINIVIL,ZESTRIL) 40 MG tablet; Take 1 tablet (40 mg total) by mouth daily.  Dispense: 30 tablet; Refill: 5  3. Cerebrovascular accident (CVA), unspecified mechanism ([place]) -many years ago, was when dementia began per her daughter and memory has  been declining since -cont some degree of secondary prevention as appropriate for her age and functional status  4. Chronic obstructive pulmonary disease, unspecified COPD type ([facility]) -stable, no wheezing, uses spiriva and prn albuterol  -monitor for ability to use device (?benefit from respimat instead)  5. Vascular dementia with behavioral disturbance -gradually progressing per daughter (?AD mix) -tolerating namzaric well so cont same -cont assistance with adls and meds by daughter  6. Exocrine pancreatic insufficiency -cont creon which has helped -recommended also trying lactaid when she eats ice cream due to diarrhea she develops afterward (more lactose intolerance common with age)  4. Urge incontinence of urine -ongoing, using depends and going frequently to try to prevent accidents  8. Need for influenza vaccination -finally given today  Labs/tests ordered:  No orders of the defined types were placed in this encounter.  Next appt:  4 mos with [name] for med mgt, come fasting for labs   [place]. Nikaela Coyne, D.O. [facility] [address], [place] [address] Cell Phone (Mon-Fri 8am-5pm):  [phone] On Call:  [phone] & follow prompts after 5pm & weekends Office Phone:   [phone] Office Fax:  [phone]

## 2016-03-12 ENCOUNTER — Other Ambulatory Visit: Payer: Self-pay | Admitting: Internal Medicine

## 2016-03-13 ENCOUNTER — Other Ambulatory Visit: Payer: Self-pay | Admitting: Nurse Practitioner

## 2016-03-19 ENCOUNTER — Other Ambulatory Visit: Payer: Self-pay | Admitting: Nurse Practitioner

## 2016-03-19 DIAGNOSIS — F01518 Vascular dementia, unspecified severity, with other behavioral disturbance: Secondary | ICD-10-CM

## 2016-03-19 DIAGNOSIS — F0151 Vascular dementia with behavioral disturbance: Secondary | ICD-10-CM

## 2016-03-20 ENCOUNTER — Other Ambulatory Visit: Payer: Self-pay | Admitting: Nurse Practitioner

## 2016-03-30 ENCOUNTER — Other Ambulatory Visit: Payer: Self-pay | Admitting: *Deleted

## 2016-03-30 DIAGNOSIS — F01518 Vascular dementia, unspecified severity, with other behavioral disturbance: Secondary | ICD-10-CM

## 2016-03-30 DIAGNOSIS — F0151 Vascular dementia with behavioral disturbance: Secondary | ICD-10-CM

## 2016-03-30 MED ORDER — TIOTROPIUM BROMIDE MONOHYDRATE 18 MCG IN CAPS: ORAL_CAPSULE | RESPIRATORY_TRACT | 0 refills | Status: DC

## 2016-03-30 MED ORDER — MEMANTINE HCL-DONEPEZIL HCL ER 28-10 MG PO CP24: 1.0000 | ORAL_CAPSULE | Freq: Every day | ORAL | 0 refills | Status: DC

## 2016-03-30 NOTE — Telephone Encounter (Signed)
[facility] 

## 2016-04-20 ENCOUNTER — Telehealth: Payer: Self-pay | Admitting: Nurse Practitioner

## 2016-04-20 NOTE — Telephone Encounter (Signed)
left another msg asking pt to confirm AWV/EV...VDM (DD)

## 2016-05-08 ENCOUNTER — Other Ambulatory Visit: Payer: Self-pay | Admitting: Nurse Practitioner

## 2016-05-18 ENCOUNTER — Other Ambulatory Visit: Payer: Self-pay | Admitting: Nurse Practitioner

## 2016-05-18 ENCOUNTER — Other Ambulatory Visit: Payer: Self-pay | Admitting: Internal Medicine

## 2016-06-11 ENCOUNTER — Encounter (HOSPITAL_COMMUNITY): Payer: Self-pay | Admitting: *Deleted

## 2016-06-11 ENCOUNTER — Emergency Department (HOSPITAL_COMMUNITY)
Admission: EM | Admit: 2016-06-11 | Discharge: 2016-06-12 | Disposition: A | Discharge status: Home / Self Care | Payer: Medicare Other | Attending: Emergency Medicine | Admitting: Emergency Medicine

## 2016-06-11 ENCOUNTER — Emergency Department (HOSPITAL_COMMUNITY): Payer: Medicare Other

## 2016-06-11 DIAGNOSIS — S99911A Unspecified injury of right ankle, initial encounter: Secondary | ICD-10-CM | POA: Diagnosis not present

## 2016-06-11 DIAGNOSIS — Y929 Unspecified place or not applicable: Secondary | ICD-10-CM | POA: Diagnosis not present

## 2016-06-11 DIAGNOSIS — I110 Hypertensive heart disease with heart failure: Secondary | ICD-10-CM | POA: Insufficient documentation

## 2016-06-11 DIAGNOSIS — I509 Heart failure, unspecified: Secondary | ICD-10-CM | POA: Insufficient documentation

## 2016-06-11 DIAGNOSIS — Z87891 Personal history of nicotine dependence: Secondary | ICD-10-CM | POA: Insufficient documentation

## 2016-06-11 DIAGNOSIS — Z79899 Other long term (current) drug therapy: Secondary | ICD-10-CM | POA: Insufficient documentation

## 2016-06-11 DIAGNOSIS — X501XXA Overexertion from prolonged static or awkward postures, initial encounter: Secondary | ICD-10-CM | POA: Diagnosis not present

## 2016-06-11 DIAGNOSIS — T148XXA Other injury of unspecified body region, initial encounter: Secondary | ICD-10-CM | POA: Diagnosis not present

## 2016-06-11 DIAGNOSIS — Y939 Activity, unspecified: Secondary | ICD-10-CM | POA: Insufficient documentation

## 2016-06-11 DIAGNOSIS — Z8673 Personal history of transient ischemic attack (TIA), and cerebral infarction without residual deficits: Secondary | ICD-10-CM | POA: Diagnosis not present

## 2016-06-11 DIAGNOSIS — Y999 Unspecified external cause status: Secondary | ICD-10-CM | POA: Diagnosis not present

## 2016-06-11 DIAGNOSIS — J449 Chronic obstructive pulmonary disease, unspecified: Secondary | ICD-10-CM | POA: Diagnosis not present

## 2016-06-11 DIAGNOSIS — M25571 Pain in right ankle and joints of right foot: Secondary | ICD-10-CM | POA: Diagnosis not present

## 2016-06-11 LAB — CBC WITH DIFFERENTIAL/PLATELET
BASOS ABS: 0 10*3/uL (ref 0.0–0.1)
Basophils Relative: 0 %
EOS PCT: 0 %
Eosinophils Absolute: 0.1 10*3/uL (ref 0.0–0.7)
HEMATOCRIT: 43.4 % (ref 36.0–46.0)
Hemoglobin: 13.1 g/dL (ref 12.0–15.0)
LYMPHS PCT: 14 %
Lymphs Abs: 2.2 10*3/uL (ref 0.7–4.0)
MCH: 26.8 pg (ref 26.0–34.0)
MCHC: 30.2 g/dL (ref 30.0–36.0)
MCV: 88.9 fL (ref 78.0–100.0)
MONO ABS: 1.3 10*3/uL — AB (ref 0.1–1.0)
MONOS PCT: 9 %
NEUTROS ABS: 11.5 10*3/uL — AB (ref 1.7–7.7)
Neutrophils Relative %: 77 %
PLATELETS: 239 10*3/uL (ref 150–400)
RBC: 4.88 MIL/uL (ref 3.87–5.11)
RDW: 17.8 % — AB (ref 11.5–15.5)
WBC: 15 10*3/uL — AB (ref 4.0–10.5)

## 2016-06-11 LAB — BASIC METABOLIC PANEL
Anion gap: 14 (ref 5–15)
BUN: 49 mg/dL — ABNORMAL HIGH (ref 6–20)
CHLORIDE: 110 mmol/L (ref 101–111)
CO2: 23 mmol/L (ref 22–32)
CREATININE: 1.97 mg/dL — AB (ref 0.44–1.00)
Calcium: 9.6 mg/dL (ref 8.9–10.3)
GFR calc non Af Amer: 24 mL/min — ABNORMAL LOW (ref >60)
GFR, EST AFRICAN AMERICAN: 28 mL/min — AB (ref >60)
Glucose, Bld: 116 mg/dL — ABNORMAL HIGH (ref 65–99)
Potassium: 5.2 mmol/L — ABNORMAL HIGH (ref 3.5–5.1)
SODIUM: 147 mmol/L — AB (ref 135–145)

## 2016-06-11 MED ORDER — ALBUTEROL SULFATE HFA 108 (90 BASE) MCG/ACT IN AERS
2.0000 | INHALATION_SPRAY | Freq: Four times a day (QID) | RESPIRATORY_TRACT | Status: DC | PRN
  Administered 2016-06-12: 2 via RESPIRATORY_TRACT
  Filled 2016-06-11: qty 6.7

## 2016-06-11 MED ORDER — TIOTROPIUM BROMIDE MONOHYDRATE 18 MCG IN CAPS
18.0000 ug | ORAL_CAPSULE | Freq: Every day | RESPIRATORY_TRACT | Status: DC
  Administered 2016-06-11 – 2016-06-12 (×2): 18 ug via RESPIRATORY_TRACT
  Filled 2016-06-11: qty 5

## 2016-06-11 MED ORDER — CARVEDILOL 25 MG PO TABS
25.0000 mg | ORAL_TABLET | Freq: Two times a day (BID) | ORAL | Status: DC
  Administered 2016-06-12: 25 mg via ORAL
  Filled 2016-06-11 (×4): qty 1

## 2016-06-11 MED ORDER — SIMVASTATIN 20 MG PO TABS
20.0000 mg | ORAL_TABLET | Freq: Every day | ORAL | Status: DC
  Administered 2016-06-11 – 2016-06-12 (×2): 20 mg via ORAL
  Filled 2016-06-11 (×2): qty 1

## 2016-06-11 MED ORDER — CHLORTHALIDONE 25 MG PO TABS
25.0000 mg | ORAL_TABLET | Freq: Every day | ORAL | Status: DC
  Administered 2016-06-12: 25 mg via ORAL
  Filled 2016-06-11 (×2): qty 1

## 2016-06-11 MED ORDER — MEMANTINE HCL-DONEPEZIL HCL ER 28-10 MG PO CP24: 1.0000 | ORAL_CAPSULE | Freq: Every day | ORAL | Status: DC

## 2016-06-11 MED ORDER — LISINOPRIL 20 MG PO TABS
40.0000 mg | ORAL_TABLET | Freq: Every day | ORAL | Status: DC
  Filled 2016-06-11 (×2): qty 2

## 2016-06-11 MED ORDER — TRAMADOL HCL 50 MG PO TABS
25.0000 mg | ORAL_TABLET | Freq: Once | ORAL | Status: AC
  Administered 2016-06-11: 25 mg via ORAL
  Filled 2016-06-11: qty 1

## 2016-06-11 MED ORDER — IBUPROFEN 200 MG PO TABS
400.0000 mg | ORAL_TABLET | Freq: Once | ORAL | Status: AC
  Administered 2016-06-11: 400 mg via ORAL
  Filled 2016-06-11: qty 2

## 2016-06-11 MED ORDER — MEMANTINE HCL ER 28 MG PO CP24
28.0000 mg | ORAL_CAPSULE | Freq: Every day | ORAL | Status: DC
  Administered 2016-06-11 – 2016-06-12 (×2): 28 mg via ORAL
  Filled 2016-06-11 (×2): qty 1

## 2016-06-11 MED ORDER — DONEPEZIL HCL 5 MG PO TABS
10.0000 mg | ORAL_TABLET | Freq: Every day | ORAL | Status: DC
  Administered 2016-06-11 – 2016-06-12 (×2): 10 mg via ORAL
  Filled 2016-06-11 (×2): qty 2

## 2016-06-11 NOTE — Clinical Social Work Note (Signed)
Clinical Social Work Assessment  Patient Details  Name: Carol Monroe MRN: [id number] Date of Birth: [birth year]  Date of referral:  06/11/16               Reason for consult:  Facility Placement                Permission sought to share information with:  Chartered certified accountant granted to share information::  Yes, Verbal Permission Granted  Name::        Agency::     Relationship::     Contact Information:     Housing/Transportation Living arrangements for the past 2 months:  Single Family Home Source of Information:  Adult Children Patient Interpreter Needed:  None Criminal Activity/Legal Involvement Pertinent to Current Situation/Hospitalization:  No - Comment as needed Significant Relationships:  Adult Children Lives with:  Adult Children Do you feel safe going back to the place where you live?  Yes Need for family participation in patient care:  Yes (Comment)  Care giving concerns:  Patient unable to bear weight on right ankle. Patient resides with daughter, patient's daughter reported that she is unable to care for patient at home without patient being able to ambulate.    Social Worker assessment / plan:  CSW spoke with patient's daughter Theressa Stamps [phone]) and granddaughter Rose Phi [phone]) at beside, patient alert and oriented to self. Patient's daughter reported that patient resides in the home with her and she assists patient with ADLs. Patient's daughter reported that patient was walking freely until about a week ago and when she began to hold onto things when walking then she began dragging her right foot. Patient's daughter reports that patient is not able to walk anymore and that she is unable to care for patient in the home in her current condition. Patient's daughter reported that she wanted patient to go to rehab at [facility], preferably [place] or [facility] due to close proximity to home. Patient's daughter reported that she is interested  in [place] transporting patient to SNF. CSW explained SNF placement process to patient's daughter and discussed patient's insurance. CSW explained that PT has to see patient and recommend SNF for medicare to cover the cost, patient's daughter verbalized understanding. CSW awaiting PT recommendation and will assist with discharge planning based on recommendation.   Employment status:  Retired Forensic scientist:  Information systems manager, Medicaid In [place] PT Recommendations:  Not assessed at this time [place] / Referral to community resources:  [place]  Patient/Family's Response to care:  Patient's daughter agreeable to SNF for ST rehab if recommended by PT.   Patient/Family's Understanding of and Emotional Response to Diagnosis, Current Treatment, and Prognosis: Patient's daughter and granddaughter presented pleasant when speaking with CSW and expressed concern about being unable to care for patient in her current state. Patient's daughter and granddaughter verbalized understanding of SNF placement process and that PT had to recommend SNF for insurance to cover cost. Patient's daughter reported that she wanted patient to return home after ST rehab at [facility]. Patient was alert during assessment but did not speak.   Emotional Assessment Appearance:  Appears stated age Attitude/Demeanor/Rapport:  Unable to Assess Affect (typically observed):  Unable to Assess Orientation:  Oriented to Self Alcohol / Substance use:  Not Applicable Psych involvement (Current and /or in the community):  No (Comment)  Discharge Needs  Concerns to be addressed:  Care Coordination Readmission within the last 30 days:  No Current discharge risk:  None Barriers to Discharge:  Continued Medical Work up   [employer], LCSW 06/11/2016, 4:42 PM

## 2016-06-11 NOTE — Discharge Instructions (Signed)
As discussed, your evaluation today has been largely reassuring.  But, it is important that you monitor your condition carefully, and do not hesitate to return to the ED if you develop new, or concerning changes in your condition. ? ?Otherwise, please follow-up with your physician for appropriate ongoing care. ? ?

## 2016-06-11 NOTE — ED Notes (Signed)
Bed: LG49 Expected date:  Expected time:  Means of arrival:  Comments: Room 6

## 2016-06-11 NOTE — ED Provider Notes (Addendum)
[facility] DEPT Provider Note   CSN: [id number] Arrival date & time: 06/11/16  1240     History   Chief Complaint Chief Complaint  Patient presents with  . Ankle Pain    HPI Carol Monroe is a 76 y.o. female.  HPI  Patient presents with concern of new ankle pain. Etiology unclear, onset unclear, but it seems as since earlier today the patient is complained of right ankle pain, worse with ambulation and weightbearing. Patient has dementia, which limits the history of present illness, level V caveat. Per report the patient also has multiple other medical issues include COPD, CHF. No report of fall, trauma, and the patient cannot specify anything about when her ankle began to bother her.  Past Medical History:  Diagnosis Date  . CHF (congestive heart failure) ([place])   . COPD (chronic obstructive pulmonary disease) ([place])   . Dementia   . Heart attack ([place])   . Hypertension   . Murmur, heart   . Stroke [facility])     Patient Active Problem List   Diagnosis Date Noted  . Urinary incontinence 04/09/2014  . Special screening for malignant neoplasms, colon 12/25/2013  . Dyspnea 12/03/2013  . Other and unspecified hyperlipidemia 07/10/2013  . Exocrine pancreatic insufficiency 04/10/2013  . Vascular dementia with behavioral disturbance 04/10/2013  . Essential hypertension, benign 04/10/2013  . Cerumen impaction 04/24/2012  . Dementia   . Hypertension   . Stroke ([place])   . COPD (chronic obstructive pulmonary disease) ([place])   . CHF (congestive heart failure) ([place])     Past Surgical History:  Procedure Laterality Date  . ABDOMINAL HYSTERECTOMY     Partial     OB History    No data available       Home Medications    Prior to Admission medications   Medication Sig Start Date End Date Taking? Authorizing Provider  albuterol (PROVENTIL HFA;VENTOLIN HFA) 108 (90 Base) MCG/ACT inhaler Inhale 2 puffs into the lungs every 6 (six) hours as needed for wheezing or shortness  of breath. 07/23/15  Yes Lauree Chandler, NP  carvedilol (COREG) 25 MG tablet TAKE 1 TABLET (25 MG TOTAL) BY MOUTH 2 (TWO) TIMES DAILY WITH A MEAL. 05/18/16  Yes Lauree Chandler, NP  chlorthalidone (HYGROTON) 25 MG tablet TAKE 1 TABLET (25 MG TOTAL) BY MOUTH EVERY MORNING. 03/20/16  Yes Lauree Chandler, NP  CREON 24000-76000 units CPEP TAKE ONE CAPSULE BY MOUTH 3 TIMES A DAY WITH MEALS 05/18/16  Yes Lauree Chandler, NP  KLOR-CON M20 20 MEQ tablet TAKE 1 TABLET EVERY DAY ON MON,WED,FRI AND SAT 05/08/16  Yes Lauree Chandler, NP  lisinopril (PRINIVIL,ZESTRIL) 40 MG tablet Take 1 tablet (40 mg total) by mouth daily. 02/21/16  Yes Reed, Tiffany L, DO  Memantine HCl-Donepezil HCl (NAMZARIC) 28-10 MG CP24 Use as directed 1 capsule in the mouth or throat daily. 03/30/16  Yes Lauree Chandler, NP  simvastatin (ZOCOR) 20 MG tablet TAKE ONE TABLET BY MOUTH ONCE DAILY IN THE EVENING 03/13/16  Yes Lauree Chandler, NP  tiotropium (SPIRIVA HANDIHALER) 18 MCG inhalation capsule PLACE 1 CAPSULE INTO INHALER AND INHALE DAILY Patient taking differently: Place 18 mcg into inhaler and inhale daily. PLACE 1 CAPSULE INTO INHALER AND INHALE DAILY 03/30/16  Yes Lauree Chandler, NP    Family History Family History  Problem Relation Age of Onset  . Heart disease Mother   . Diabetes Mother   . Diabetes Father   . Breast cancer  Daughter     Social History Social History  Substance Use Topics  . Smoking status: Former Smoker    Packs/day: 2.00    Years: 40.00    Types: Cigarettes    Quit date: 01/24/2006  . Smokeless tobacco: Never Used  . Alcohol use No     Allergies   Patient has no known allergies.   Review of Systems Review of Systems  Unable to perform ROS: Dementia     Physical Exam Updated Vital Signs BP 103/68 (BP Location: Right Arm)   Pulse 89   Temp 97.4 F (36.3 C) (Oral)   Resp 16   SpO2 95%   Physical Exam  Constitutional: She appears toxic. She has a sickly  appearance. No distress.  HENT:  Head: Normocephalic and atraumatic.  Eyes: Conjunctivae and EOM are normal.  Cardiovascular: Normal rate and regular rhythm.   Pulmonary/Chest: Effort normal and breath sounds normal. No stridor. No respiratory distress.  Abdominal: She exhibits no distension.  Musculoskeletal: She exhibits no edema.       Right knee: Normal.       Right ankle: She exhibits decreased range of motion. She exhibits no swelling, no ecchymosis, no deformity, no laceration and normal pulse. Tenderness. Medial malleolus tenderness found. No proximal fibula tenderness found. Achilles tendon exhibits normal Thompson's test results.  Patient has intact dorsal and plantar flexion of the ankle, but is hesitant to move it substantially secondary to pain.  Minimal edema, no erythema about the ankle  Neurological: She is alert. She displays atrophy. No cranial nerve deficit. She exhibits normal muscle tone. Coordination normal.  Patient awake and alert, but disoriented. Patient does follow commands appropriately.  Speech is clear, face is symmetric.  Skin: Skin is warm and dry.  Psychiatric: She is slowed and withdrawn. Cognition and memory are impaired.  Nursing note and vitals reviewed.    ED Treatments / Results   Radiology Dg Ankle Complete Right  Result Date: 06/11/2016 CLINICAL DATA:  Pain without trauma. EXAM: RIGHT ANKLE - COMPLETE 3+ VIEW COMPARISON:  None. FINDINGS: There is no evidence of fracture, dislocation, or joint effusion. There is no evidence of arthropathy or other focal bone abnormality. Soft tissues are unremarkable. IMPRESSION: Negative. Electronically Signed   By: Dorise Bullion III M.D   On: 06/11/2016 13:56    Procedures Procedures (including critical care time)  Medications Ordered in ED Medications - No data to display   Initial Impression / Assessment and Plan / ED Course  I have reviewed the triage vital signs and the nursing notes.  Pertinent  labs & imaging results that were available during my care of the patient were reviewed by me and considered in my medical decision making (see chart for details).  After the initial evaluation the patient had x-ray performed of her ankle. Initial vitals notable for mild hypotension, mild hypoxia, but the patient has CHF, COPD, baseline chronic medical issues.  On repeat exam the patient states that her ankle feels less painful.  This elderly female with multiple medical issues including dementia, COPD, CHF presents with ankle pain, unclear etiology. Patient is afebrile, with no erythema, no warmth, low suspicion for septic arthritis. Patient has focal tenderness about the medial aspect, suggesting sprain versus strain. Some consideration of gout, though this would be an atypical presentation. Patient received splinting, analgesia, but was unable to bear weight.  Family members arrived after the patient's evaluation, treatment, corroborate that the patient has complained only of new ankle pain,  I also notes that they have no recollection of when this began, but seems to become more present today. They deny history of fall, trauma.   4:04 PM As the patient cannot ambulate, I have paged her case management team to help evaluate for possible rehabilitation placement. The patient denies other complaints, states that she is otherwise well, denies pain anywhere else. She has received her splint, without complication. Family members amenable to discussion with case management for additional services.  Final Clinical Impressions(s) / ED Diagnoses  Ankle pain, right, initial encounter   Carmin Muskrat, MD 06/11/16 1447    Carmin Muskrat, MD 06/11/16 [phone]

## 2016-06-11 NOTE — ED Triage Notes (Addendum)
Per EMS, complains of right ankle pain since this morning. Pt denies injury. Pt was unable to put weight on right leg. Pt had O2 of 87 upon EMS arrival, was placed on 2L. Pt has hx of COPD, stroke and dementia. Pt lives with family at home.   BP 124/78 HR 100 RR 20 SpO2 94% on 2L

## 2016-06-11 NOTE — ED Notes (Signed)
BP medication held d/t hypotension and family concern that her BP was lower than normal.

## 2016-06-11 NOTE — ED Notes (Signed)
Attempted to ambulate patient after placement of ASO, pt was unable to bear weight on the RLE. Pt required full assist out of the bed and 2 person assist to get back to the bed. Dr. Vanita Panda made aware of the same, order for case management

## 2016-06-11 NOTE — ED Notes (Signed)
Ortho tech at the bedside.  

## 2016-06-11 NOTE — ED Notes (Signed)
Made ortho tech aware of ASO order

## 2016-06-12 DIAGNOSIS — Z7409 Other reduced mobility: Secondary | ICD-10-CM | POA: Diagnosis not present

## 2016-06-12 DIAGNOSIS — R4182 Altered mental status, unspecified: Secondary | ICD-10-CM | POA: Diagnosis not present

## 2016-06-12 DIAGNOSIS — M25571 Pain in right ankle and joints of right foot: Secondary | ICD-10-CM | POA: Diagnosis not present

## 2016-06-12 LAB — URINALYSIS, ROUTINE W REFLEX MICROSCOPIC
Bilirubin Urine: NEGATIVE
GLUCOSE, UA: NEGATIVE mg/dL
HGB URINE DIPSTICK: NEGATIVE
Ketones, ur: NEGATIVE mg/dL
LEUKOCYTES UA: NEGATIVE
Nitrite: NEGATIVE
PROTEIN: NEGATIVE mg/dL
Specific Gravity, Urine: 1.018 (ref 1.005–1.030)
pH: 5 (ref 5.0–8.0)

## 2016-06-12 NOTE — ED Notes (Signed)
Report called to [facility] at this time given to Amalia Hailey, Therapist, sports . Pt will be located on the [place]

## 2016-06-12 NOTE — NC FL2 (Signed)
[place] LEVEL OF CARE SCREENING TOOL     IDENTIFICATION  Patient Name: Carol Monroe Birthdate: 10/25/1940 Sex: female Admission Date (Current Location): 06/11/2016  [facility] and [place] Number:  Herbalist and Address:  [facility],  [place] [address], [place]      Provider Number: [id number]  Attending Physician Name and Address:  Carmin Muskrat, MD  Relative Name and Phone Number:       Current Level of Care: Hospital Recommended Level of Care: [place] Prior Approval Number:    Date Approved/Denied:   PASRR Number:   [id number]   Discharge Plan: SNF    Current Diagnoses: Patient Active Problem List   Diagnosis Date Noted  . Urinary incontinence 04/09/2014  . Special screening for malignant neoplasms, colon 12/25/2013  . Dyspnea 12/03/2013  . Other and unspecified hyperlipidemia 07/10/2013  . Exocrine pancreatic insufficiency 04/10/2013  . Vascular dementia with behavioral disturbance 04/10/2013  . Essential hypertension, benign 04/10/2013  . Cerumen impaction 04/24/2012  . Dementia   . Hypertension   . Stroke ([place])   . COPD (chronic obstructive pulmonary disease) ([place])   . CHF (congestive heart failure) (H[facility])     Orientation RESPIRATION BLADDER Height & Weight     Self  Normal Incontinent Weight:   Height:     BEHAVIORAL SYMPTOMS/MOOD NEUROLOGICAL BOWEL NUTRITION STATUS      Incontinent Diet (regular)  AMBULATORY STATUS COMMUNICATION OF NEEDS Skin   Limited Assist                           Personal Care Assistance Level of Assistance  Bathing, Feeding, Dressing Bathing Assistance: Limited assistance Feeding assistance: Independent Dressing Assistance: Limited assistance     Functional Limitations Info             SPECIAL CARE FACTORS FREQUENCY  PT (By licensed PT), OT (By licensed OT)                    Contractures Contractures Info: Not present    Additional  Factors Info  Code Status, Allergies Code Status Info: Not on file  Allergies Info: NKA            Current Medications (06/12/2016):  This is the current hospital active medication list Current Facility-Administered Medications  Medication Dose Route Frequency Provider Last Rate Last Dose  . albuterol (PROVENTIL HFA;VENTOLIN HFA) 108 (90 Base) MCG/ACT inhaler 2 puff  2 puff Inhalation Q6H PRN Carmin Muskrat, MD      . carvedilol (COREG) tablet 25 mg  25 mg Oral BID WC Carmin Muskrat, MD   Stopped at 06/11/16 1717  . chlorthalidone (HYGROTON) tablet 25 mg  25 mg Oral Daily Carmin Muskrat, MD      . memantine (NAMENDA XR) 24 hr capsule 28 mg  28 mg Oral Daily Carmin Muskrat, MD   28 mg at 06/11/16 1800   And  . donepezil (ARICEPT) tablet 10 mg  10 mg Oral Daily Carmin Muskrat, MD   10 mg at 06/11/16 1800  . lisinopril (PRINIVIL,ZESTRIL) tablet 40 mg  40 mg Oral Daily Carmin Muskrat, MD      . simvastatin (ZOCOR) tablet 20 mg  20 mg Oral q1800 Carmin Muskrat, MD   20 mg at 06/11/16 1800  . tiotropium (SPIRIVA) inhalation capsule 18 mcg  18 mcg Inhalation Daily Carmin Muskrat, MD   18 mcg at 06/11/16 1800  Current Outpatient Prescriptions  Medication Sig Dispense Refill  . albuterol (PROVENTIL HFA;VENTOLIN HFA) 108 (90 Base) MCG/ACT inhaler Inhale 2 puffs into the lungs every 6 (six) hours as needed for wheezing or shortness of breath. 1 Inhaler 2  . carvedilol (COREG) 25 MG tablet TAKE 1 TABLET (25 MG TOTAL) BY MOUTH 2 (TWO) TIMES DAILY WITH A MEAL. 180 tablet 1  . chlorthalidone (HYGROTON) 25 MG tablet TAKE 1 TABLET (25 MG TOTAL) BY MOUTH EVERY MORNING. 45 tablet 4  . CREON 24000-76000 units CPEP TAKE ONE CAPSULE BY MOUTH 3 TIMES A DAY WITH MEALS 90 capsule 1  . KLOR-CON M20 20 MEQ tablet TAKE 1 TABLET EVERY DAY ON MON,WED,FRI AND SAT 48 tablet 1  . lisinopril (PRINIVIL,ZESTRIL) 40 MG tablet Take 1 tablet (40 mg total) by mouth daily. 30 tablet 5  . Memantine HCl-Donepezil  HCl (NAMZARIC) 28-10 MG CP24 Use as directed 1 capsule in the mouth or throat daily. 90 capsule 0  . simvastatin (ZOCOR) 20 MG tablet TAKE ONE TABLET BY MOUTH ONCE DAILY IN THE EVENING 90 tablet 1  . tiotropium (SPIRIVA HANDIHALER) 18 MCG inhalation capsule PLACE 1 CAPSULE INTO INHALER AND INHALE DAILY (Patient taking differently: Place 18 mcg into inhaler and inhale daily. PLACE 1 CAPSULE INTO INHALER AND INHALE DAILY) 90 capsule 0     Discharge Medications: Please see After Visit Summary for a list of discharge medications.  Relevant Imaging Results:  Relevant Lab Results:   Additional Information SS#: [id number]  Weston Anna, LCSW

## 2016-06-12 NOTE — ED Provider Notes (Signed)
Contacted by nursing stating that the patient's transport to [facility] was here. I assessed the patient's and she had no acute complaints. No change to her physical exam. Felt she was safe for transfer. EMTALA completed.   Fatima Blank, MD 06/12/16 2233

## 2016-06-12 NOTE — Progress Notes (Signed)
CSW waiting for confirmation for patient to dc to [facility].   Kingsley Spittle, LCSWA Clinical Social Worker [phone]

## 2016-06-12 NOTE — ED Notes (Signed)
Pt has been accepted at [facility] at this time

## 2016-06-12 NOTE — ED Notes (Signed)
PT is at bedside with walker.

## 2016-06-12 NOTE — ED Notes (Signed)
Pt bathe, bed change, and gown change at this time. Used wheelchair to assist to BR with 2 assist.

## 2016-06-12 NOTE — Progress Notes (Addendum)
CSW received a call from [place] at [facility] Pt has been accepted by[facility] Number for report is:  [phone] Pt's room/bed number will be: TBD Please request when calling for report Accepting physician: SNF MD  Pt can arrive ASAP on Monday 06/12/16  CSW will update RN and EDP. Family and pt notified at bedside.  Carol Monroe. Carol Monroe, Latanya Presser, LCAS Clinical Social Worker Ph: [phone]

## 2016-06-12 NOTE — ED Notes (Addendum)
Daughter at bedside at this time.

## 2016-06-12 NOTE — Progress Notes (Addendum)
CSW spoke to night-time ED RN who stated she was made to understand pt was not being discharged tonight due to the pt needing additional urine testing.  CSW spoke to EDP who checked urine results for the pt and stated results were clear and pt was cleared for D/C.    CSW called pt's daughter Theressa Stamps who stated they understood pt was to be kept overnight due to testing and CSW clarified result came back and pt was deemed safe for D/C by the EDP.  Family stated they would not allow pt to be discharged to [facility] and CSW stated that as he had stated to the pt's daughter via the pt's grandson earlier in the evening that the pt is set to be D/C'd and either has to D/C to [facility] or D/C home.  Pt's daughter again stated she refused to let that happen and the CSW stated that the next step would require that if the pt's daughter refused to let the pt transfer to [facility] and refused to pick the pt up the CSW would be required to call Adult Protective Services to report the family is refusing to pick the pt up.    At this point the pt's daughter stated she was insulted that the CSW would say she refused to pick the pt up and the pt's granddaughter spoke on the phone and stated the pt's granddaughter was insulted the CSW would bring up APS. CSW repeated he was just informing the family of the next step if family refused to allow the pt to be D/C'd to [facility] and/or refuse to pick the pt up.  CSW again stated he did not accuse the family of not picking them up that the CSW was merely informing the family of the next step to be take if they refuse both D/C options.  Pt's granddaughter stated she was insulted the CSW "was a a Education officer, museum".  In addition, CSW stated the pt did not meet criteria for inpatient, and was ready and cleared for D/C and as such the hospital was required to D/C pt.  Pt's daughter then stated CSW must insure all documentation would be sent to the facility describing the pt's  treatment in the hospital and to "go ahead and send her to the facility, go ahead and send her but you better send that documentation on so we can see whqat has been done for her..  CSW asked the family to clarify what documentation they are requesting besides the AVS and pt's daughter stated, "You should know you're a Education officer, museum"  Family once more stated how angry they were and demanded x-rays that were taken of the mother's foot and documentation stating "what has been done for our mother".  CSW stated he would ask the EDP or RN to clarify if anything other than the AVS would be needed and if access to the pt's X-Rays were available.  RN stated AVS details treatment received in the hospital.  Alphonse Guild. Jillianna Stanek, Latanya Presser, LCAS Clinical Social Worker Ph: [phone]

## 2016-06-12 NOTE — Progress Notes (Addendum)
CSW informed pt and pt's daughter pt has been accepted to [facility] and would be transporting shortly.  Pt and pt's daughter were agreeable to this placement and were appreciative and thanked the CSW.  7:12 PM CSW received request from RN to speak to pt's grandson who stated pt's daughter arrived at [facility] and refuses to allow pt to be discharged to [facility].  Per grandson pt's daughter states she prefers a different facility and would like patient to "stay another night" in the ED.    CSW staffed case with CSW Asst Director and spoke to pt's grandson and informed him pt was placed at [facility] which took pt's secondary insurance of Medicaid and that Medicare, pt's primary insurance would not pay for a wider variety of preferred facilities and that with Medicaid the pt's choices were very limited, due to pt not having 3 day inpatient qualifying stay required by Medicare to pay and that there was no guarantee the pt would be placed in their preferred facility if they were not discharged.  Pt and pt's grandson were informed pt would be discharged to [facility], but that family had a choice of taking pt home, caring for her there or their placing her in a different facility of their choice once discharged.  CN informed CSW that RN stated pt's urine showed characteristics that required further testing due to possible U.T.I. So pt's D/C will be delayed while awaiting results of urine testing.  CSW awaiting results of testing.    Alphonse Guild. Carolle Ishii, Latanya Presser, LCAS Clinical Social Worker Ph: [phone]

## 2016-06-12 NOTE — ED Notes (Signed)
Pt daughter updated of pt's current status

## 2016-06-12 NOTE — Progress Notes (Signed)
CSW spoke with family member, [name], via bedside to discuss insurance and SNF placement. Patient will have to provide facility with Medicaid check and stay for at least 30 days. Patient is able to receive physical therapy at facility with Medicare Part B benefits. Patients daughter is agreeable to this plan. Daughter prefers [facility]. CSW will notify facility once PT has evaluated patient.   Kingsley Spittle, LCSWA Clinical Social Worker [phone]

## 2016-06-12 NOTE — ED Notes (Signed)
Lunch is set up at pt bedside; pt is feeding self at this time

## 2016-06-12 NOTE — ED Notes (Signed)
Pt daughter is at bedside at this time

## 2016-06-12 NOTE — ED Notes (Signed)
Pt meal tray arrived at this time

## 2016-06-12 NOTE — ED Notes (Addendum)
Pt cleaned of stool 

## 2016-06-12 NOTE — ED Notes (Signed)
Spoke with pt daughter at this time who left this ED to visit [facility]. Daughter states that she is unhappy with facility and is declining transport at this time. Pt grandson is at bedside at this time.

## 2016-06-12 NOTE — ED Notes (Signed)
PTAR  Called at this time

## 2016-06-12 NOTE — Evaluation (Signed)
Physical Therapy Evaluation Patient Details Name: Carol Monroe MRN: [id number] DOB: Oct 13, 1940 Today's Date: 06/12/2016   History of Present Illness  76 y.o. female with h/o dementia admitted with difficulty walking and R foot pain. Xray negative for acute fracture.   Clinical Impression  Pt admitted with above diagnosis. Pt currently with functional limitations due to the deficits listed below (see PT Problem List). +2 assist for sit to stand and to ambulate 4' with RW, distance limited by pain/fatigue. ST-SNF recommended.  Pt will benefit from skilled PT to increase their independence and safety with mobility to allow discharge to the venue listed below.       Follow Up Recommendations SNF    Equipment Recommendations  Rolling walker with 5" wheels;Wheelchair (measurements PT);Wheelchair cushion (measurements PT)    Recommendations for Other Services       Precautions / Restrictions Precautions Precautions: Fall Precaution Comments: pt denies falls (no family present to confirm) Required Braces or Orthoses: Other Brace/Splint Other Brace/Splint: R ankle brace Restrictions Weight Bearing Restrictions: No Other Position/Activity Restrictions: pt maintains RLE in NWB with standing      Mobility  Bed Mobility Overal bed mobility: Modified Independent             General bed mobility comments: HOB up 45* -pt performed supine to sit to supine  Transfers Overall transfer level: Needs assistance Equipment used: Rolling walker (2 wheeled) Transfers: Sit to/from Stand Sit to Stand: Mod assist;+2 safety/equipment;+2 physical assistance         General transfer comment: assist to rise/steady, VCs hand placement and to correct flexed posture  Ambulation/Gait Ambulation/Gait assistance: Mod assist Ambulation Distance (Feet): 4 Feet Assistive device: Rolling walker (2 wheeled) Gait Pattern/deviations: Step-to pattern   Gait velocity interpretation: Below normal speed for  age/gender General Gait Details: significant difficulty with sequencing/advancing LLE even with max verbal cuing; maintains RLE in NWB  Stairs            Wheelchair Mobility    Modified Rankin (Stroke Patients Only)       Balance Overall balance assessment: Needs assistance   Sitting balance-Leahy Scale: Good     Standing balance support: Bilateral upper extremity supported Standing balance-Leahy Scale: Poor Standing balance comment: relies on BUE support                             Pertinent Vitals/Pain Pain Assessment: Faces Faces Pain Scale: Hurts even more Pain Location: dorsum of R foot with palpation and attempted weight bearing, no pain at rest Pain Descriptors / Indicators: Sore Pain Intervention(s): Limited activity within patient's tolerance;Monitored during session    Home Living Family/patient expects to be discharged to:: Private residence Living Arrangements: Children Available Help at Discharge: Family;Available 24 hours/day   Home Access: Stairs to enter   Entrance Stairs-Number of Steps: pt can't remember number of stairs          Prior Function Level of Independence: Independent;Needs assistance      ADL's / Homemaking Assistance Needed: assist from daughter for bathing/dressing  Comments: walked without AD, denies falls     Hand Dominance        Extremity/Trunk Assessment   Upper Extremity Assessment Upper Extremity Assessment: Overall WFL for tasks assessed    Lower Extremity Assessment Lower Extremity Assessment: RLE deficits/detail RLE Deficits / Details: R ankle DF AROM decreased 40%-limited by pain RLE Sensation: decreased light touch    Cervical / Trunk Assessment Cervical /  Trunk Assessment: Normal  Communication   Communication: No difficulties  Cognition Arousal/Alertness: Awake/alert Behavior During Therapy: WFL for tasks assessed/performed Overall Cognitive Status: No family/caregiver present to  determine baseline cognitive functioning                                 General Comments: can follow commands, oriented to self and location, vague historian & unable to recall details of home setup      General Comments      Exercises     Assessment/Plan    PT Assessment Patient needs continued PT services  PT Problem List Decreased range of motion;Decreased activity tolerance;Decreased balance;Decreased mobility;Pain;Decreased knowledge of use of DME;Decreased cognition       PT Treatment Interventions DME instruction;Gait training;Functional mobility training;Balance training;Therapeutic exercise;Therapeutic activities;Patient/family education    PT Goals (Current goals can be found in the Care Plan section)  Acute Rehab PT Goals Patient Stated Goal: to be able to walk PT Goal Formulation: With patient Time For Goal Achievement: 06/26/16 Potential to Achieve Goals: Fair    Frequency Min 3X/week   Barriers to discharge        Co-evaluation               AM-PAC PT "6 Clicks" Daily Activity  Outcome Measure Difficulty turning over in bed (including adjusting bedclothes, sheets and blankets)?: None Difficulty moving from lying on back to sitting on the side of the bed? : None Difficulty sitting down on and standing up from a chair with arms (e.g., wheelchair, bedside commode, etc,.)?: A Lot Help needed moving to and from a bed to chair (including a wheelchair)?: A Lot Help needed walking in hospital room?: A Lot Help needed climbing 3-5 steps with a railing? : Total 6 Click Score: 15    End of Session Equipment Utilized During Treatment: Gait belt Activity Tolerance: Patient limited by fatigue;Patient limited by pain Patient left: in bed;with call bell/phone within reach Nurse Communication: Mobility status PT Visit Diagnosis: Unsteadiness on feet (R26.81);Other abnormalities of gait and mobility (R26.89);Difficulty in walking, not elsewhere  classified (R26.2)    Time: 9892-1194 PT Time Calculation (min) (ACUTE ONLY): 25 min   Charges:   PT Evaluation $PT Eval Moderate Complexity: 1 Procedure PT Treatments $Gait Training: 8-22 mins   PT G Codes:   PT G-Codes **NOT FOR INPATIENT CLASS** Functional Assessment Tool Used: AM-PAC 6 Clicks Basic Mobility Functional Limitation: Mobility: Walking and moving around Mobility: Walking and Moving Around Current Status (R7408): At least 40 percent but less than 60 percent impaired, limited or restricted Mobility: Walking and Moving Around Goal Status [phone]): At least 20 percent but less than 40 percent impaired, limited or restricted      Philomena Doheny 06/12/2016, 1:16 PM [phone]

## 2016-06-12 NOTE — Progress Notes (Signed)
CSW called [facility] at ph: [phone] who informed the CSW that the family who is either a POA or has a signed R.O.I. Can call the radiology department and request a CD of the pt's X-Rays and pick them up on 06/12/16.    CSW updated and informed the RN and EDP that pt was ready to be transported to [facility] SN[facility] at this time.   CSW called pt's family and spoke to the pt's grand-daughter and informed her how to obtain the pt's X-rays as they had requested.  Pt's granddaughter was appreciative, pleasant and thanked the CSW.  CSW will fill out a R.O.I. For family and provide to the TCU RN to be sent to the facility or the family.  Please reconsult if future social work needs arise.  CSW signing off.  Alphonse Guild. Alec Jaros, Latanya Presser, LCAS Clinical Social Worker Ph: [phone]

## 2016-06-13 ENCOUNTER — Encounter: Payer: Self-pay | Admitting: Internal Medicine

## 2016-06-13 ENCOUNTER — Non-Acute Institutional Stay (SKILLED_NURSING_FACILITY): Payer: Medicare Other | Admitting: Internal Medicine

## 2016-06-13 DIAGNOSIS — I10 Essential (primary) hypertension: Secondary | ICD-10-CM | POA: Diagnosis not present

## 2016-06-13 DIAGNOSIS — S93601A Unspecified sprain of right foot, initial encounter: Secondary | ICD-10-CM | POA: Diagnosis not present

## 2016-06-13 DIAGNOSIS — F0151 Vascular dementia with behavioral disturbance: Secondary | ICD-10-CM

## 2016-06-13 DIAGNOSIS — E782 Mixed hyperlipidemia: Secondary | ICD-10-CM

## 2016-06-13 DIAGNOSIS — N183 Chronic kidney disease, stage 3 unspecified: Secondary | ICD-10-CM

## 2016-06-13 DIAGNOSIS — I509 Heart failure, unspecified: Secondary | ICD-10-CM | POA: Diagnosis not present

## 2016-06-13 DIAGNOSIS — R262 Difficulty in walking, not elsewhere classified: Secondary | ICD-10-CM | POA: Diagnosis not present

## 2016-06-13 DIAGNOSIS — I6340 Cerebral infarction due to embolism of unspecified cerebral artery: Secondary | ICD-10-CM | POA: Diagnosis not present

## 2016-06-13 DIAGNOSIS — R0600 Dyspnea, unspecified: Secondary | ICD-10-CM | POA: Diagnosis not present

## 2016-06-13 DIAGNOSIS — Z8673 Personal history of transient ischemic attack (TIA), and cerebral infarction without residual deficits: Secondary | ICD-10-CM

## 2016-06-13 DIAGNOSIS — K8681 Exocrine pancreatic insufficiency: Secondary | ICD-10-CM | POA: Diagnosis not present

## 2016-06-13 DIAGNOSIS — F01518 Vascular dementia, unspecified severity, with other behavioral disturbance: Secondary | ICD-10-CM

## 2016-06-13 DIAGNOSIS — M25571 Pain in right ankle and joints of right foot: Secondary | ICD-10-CM | POA: Diagnosis not present

## 2016-06-13 DIAGNOSIS — J449 Chronic obstructive pulmonary disease, unspecified: Secondary | ICD-10-CM | POA: Diagnosis not present

## 2016-06-13 DIAGNOSIS — M6281 Muscle weakness (generalized): Secondary | ICD-10-CM | POA: Diagnosis not present

## 2016-06-13 DIAGNOSIS — F0281 Dementia in other diseases classified elsewhere with behavioral disturbance: Secondary | ICD-10-CM | POA: Diagnosis not present

## 2016-06-13 DIAGNOSIS — R41841 Cognitive communication deficit: Secondary | ICD-10-CM | POA: Diagnosis not present

## 2016-06-13 DIAGNOSIS — R2681 Unsteadiness on feet: Secondary | ICD-10-CM | POA: Diagnosis not present

## 2016-06-13 NOTE — Progress Notes (Signed)
Patient ID: Carol Monroe, female   DOB: [birth year], 76 y.o.   MRN: [id number]    HISTORY AND PHYSICAL   DATE: 06/13/2016  Location:    [place] Room Number: 409 B Place of Service: SNF (31)   Extended Emergency Contact Information Primary Emergency Contact: Reeder,Donna Address: [address]          [place], [place] [address] [place] Mobile Phone: [phone] Relation: Daughter  Advanced Directive information Does Patient Have a Medical Advance Directive?: No, Would patient like information on creating a medical advance directive?: No - Patient declined  Chief Complaint  Patient presents with  . New Admit To SNF    Admission    HPI:  76 yo female seen today as a new admission into SNF following ED visit for new right ankle pain. No known trauma. Right ankle xray neg for acute process. ED felt she likely had sprain/strain. Family desired SNF placement for rehab.labs on 06/11/16 revealed Na 147; K 5.2; Cr 1.97; WBC 15K; abs neutrophils 11.5K.  Today she reports right dorsal foot pain. She has tingling in her toes. She does not recall injuring foot. She is a poor historian due to dementia. Hx obtained from chart  HTN - BP stable on lisinopril, coreg, chlorthalidone with Klor-con 4 days per week  Exocrine pancreatic insufficiency - stable on creon  Dementia - vascular type. She takes namzaric daily. Albumin 4.0  COPD - stable on albuterol HFA prn; spiriva daily  Hyperlipidemia - stable on zocor. LDL 65  Past Medical History:  Diagnosis Date  . CHF (congestive heart failure) ([place])   . COPD (chronic obstructive pulmonary disease) ([place])   . Dementia   . Heart attack ([place])   . Hypertension   . Murmur, heart   . Stroke [facility])     Past Surgical History:  Procedure Laterality Date  . ABDOMINAL HYSTERECTOMY     Partial     Patient Care Team: Lauree Chandler, NP as PCP - General (Nurse Practitioner)  Social History   Social History    . Marital status: Single    Spouse name: N/A  . Number of children: 4  . Years of education: N/A   Occupational History  . retired    Social History Main Topics  . Smoking status: Former Smoker    Packs/day: 2.00    Years: 40.00    Types: Cigarettes    Quit date: 01/24/2006  . Smokeless tobacco: Never Used  . Alcohol use No  . Drug use: No  . Sexual activity: No   Other Topics Concern  . Not on file   Social History Narrative  . No narrative on file     reports that she quit smoking about 10 years ago. Her smoking use included Cigarettes. She has a 80.00 pack-year smoking history. She has never used smokeless tobacco. She reports that she does not drink alcohol or use drugs.  Family History  Problem Relation Age of Onset  . Heart disease Mother   . Diabetes Mother   . Diabetes Father   . Breast cancer Daughter    Family Status  Relation Status  . Mother Deceased at age 31       MI  . Father Deceased at age 16  . Sister Deceased  . Daughter Alive  . Sister Deceased  . Daughter Alive  . Daughter Alive  . Daughter Alive  . Daughter (Not Specified)    Immunization History  Administered Date(s) Administered  . Influenza,inj,Quad PF,36+ Mos 10/16/2012, 12/03/2013, 02/21/2016  . Pneumococcal Conjugate-13 04/10/2013, 08/20/2014    No Known Allergies  Medications: Patient's Medications  New Prescriptions   No medications on file  Previous Medications   ALBUTEROL (PROVENTIL HFA;VENTOLIN HFA) 108 (90 BASE) MCG/ACT INHALER    Inhale 2 puffs into the lungs every 6 (six) hours as needed for wheezing or shortness of breath.   CARVEDILOL (COREG) 25 MG TABLET    TAKE 1 TABLET (25 MG TOTAL) BY MOUTH 2 (TWO) TIMES DAILY WITH A MEAL.   CHLORTHALIDONE (HYGROTON) 25 MG TABLET    TAKE 1 TABLET (25 MG TOTAL) BY MOUTH EVERY MORNING.   CREON 24000-76000 UNITS CPEP    TAKE ONE CAPSULE BY MOUTH 3 TIMES A DAY WITH MEALS   KLOR-CON M20 20 MEQ TABLET    TAKE 1 TABLET EVERY DAY ON  MON,WED,FRI AND SAT   LISINOPRIL (PRINIVIL,ZESTRIL) 40 MG TABLET    Take 1 tablet (40 mg total) by mouth daily.   MEMANTINE HCL-DONEPEZIL HCL (NAMZARIC) 28-10 MG CP24    Use as directed 1 capsule in the mouth or throat daily.   SIMVASTATIN (ZOCOR) 20 MG TABLET    TAKE ONE TABLET BY MOUTH ONCE DAILY IN THE EVENING   TIOTROPIUM (SPIRIVA HANDIHALER) 18 MCG INHALATION CAPSULE    PLACE 1 CAPSULE INTO INHALER AND INHALE DAILY  Modified Medications   No medications on file  Discontinued Medications   No medications on file    Review of Systems  Unable to perform ROS: Dementia    Vitals:   06/13/16 0958  BP: 110/62  Pulse: 88  Resp: 18  Temp: 98.7 F (37.1 C)  TempSrc: Oral  SpO2: 95%  Weight: 157 lb 6.6 oz (71.4 kg)  Height: 5' (1.524 m)   Body mass index is 30.74 kg/m.  Physical Exam  Constitutional: She appears well-developed.  Frail appearing sitting in w/c in NAD  HENT:  Mouth/Throat: Oropharynx is clear and moist. No oropharyngeal exudate.  MMM; no oral thrush  Eyes: Pupils are equal, round, and reactive to light. No scleral icterus.  Neck: Neck supple. Carotid bruit is not present. No tracheal deviation present. No thyromegaly present.  Cardiovascular: Normal rate, regular rhythm and intact distal pulses.  Exam reveals no gallop and no friction rub.   Murmur (1/6 SEM) heard. No LE edema b/l. no calf TTP.   Pulmonary/Chest: Effort normal. No stridor. No respiratory distress. She has wheezes (end expiratory wheezing with prolonged expirtaory phase). She has no rales.  Abdominal: Soft. Bowel sounds are normal. She exhibits no distension and no mass. There is no hepatomegaly. There is no tenderness. There is no rebound and no guarding.  Musculoskeletal: She exhibits edema and tenderness.  Right dorsal foot and medial ankle swelling with TTP and reduced ROM. She exhibits pain on right dorsiflexion of foot.   Lymphadenopathy:    She has no cervical adenopathy.  Neurological:  She is alert.  Plantarflexion strength 4/5 on right; oriented to name only  Skin: Skin is warm and dry. No rash noted.  Psychiatric: She has a normal mood and affect. Her behavior is normal.     Labs reviewed: Admission on 06/11/2016, Discharged on 06/12/2016  Component Date Value Ref Range Status  . Sodium 06/11/2016 147* 135 - 145 mmol/L Final  . Potassium 06/11/2016 5.2* 3.5 - 5.1 mmol/L Final   MODERATE HEMOLYSIS  . Chloride 06/11/2016 110  101 - 111 mmol/L Final  . CO2  06/11/2016 23  22 - 32 mmol/L Final  . Glucose, Bld 06/11/2016 116* 65 - 99 mg/dL Final  . BUN 06/11/2016 49* 6 - 20 mg/dL Final  . Creatinine, Ser 06/11/2016 1.97* 0.44 - 1.00 mg/dL Final  . Calcium 06/11/2016 9.6  8.9 - 10.3 mg/dL Final  . GFR calc non Af Amer 06/11/2016 24* >60 mL/min Final  . GFR calc Af Amer 06/11/2016 28* >60 mL/min Final   Comment: (NOTE) The eGFR has been calculated using the CKD EPI equation. This calculation has not been validated in all clinical situations. eGFR's persistently <60 mL/min signify possible Chronic Kidney Disease.   . Anion gap 06/11/2016 14  5 - 15 Final  . WBC 06/11/2016 15.0* 4.0 - 10.5 K/uL Final  . RBC 06/11/2016 4.88  3.87 - 5.11 MIL/uL Final  . Hemoglobin 06/11/2016 13.1  12.0 - 15.0 g/dL Final  . HCT 06/11/2016 43.4  36.0 - 46.0 % Final  . MCV 06/11/2016 88.9  78.0 - 100.0 fL Final  . MCH 06/11/2016 26.8  26.0 - 34.0 pg Final  . MCHC 06/11/2016 30.2  30.0 - 36.0 g/dL Final  . RDW 06/11/2016 17.8* 11.5 - 15.5 % Final  . Platelets 06/11/2016 239  150 - 400 K/uL Final  . Neutrophils Relative % 06/11/2016 77  % Final  . Neutro Abs 06/11/2016 11.5* 1.7 - 7.7 K/uL Final  . Lymphocytes Relative 06/11/2016 14  % Final  . Lymphs Abs 06/11/2016 2.2  0.7 - 4.0 K/uL Final  . Monocytes Relative 06/11/2016 9  % Final  . Monocytes Absolute 06/11/2016 1.3* 0.1 - 1.0 K/uL Final  . Eosinophils Relative 06/11/2016 0  % Final  . Eosinophils Absolute 06/11/2016 0.1  0.0 -  0.7 K/uL Final  . Basophils Relative 06/11/2016 0  % Final  . Basophils Absolute 06/11/2016 0.0  0.0 - 0.1 K/uL Final  . Color, Urine 06/12/2016 YELLOW  YELLOW Final  . APPearance 06/12/2016 HAZY* CLEAR Final  . Specific Gravity, Urine 06/12/2016 1.018  1.005 - 1.030 Final  . pH 06/12/2016 5.0  5.0 - 8.0 Final  . Glucose, UA 06/12/2016 NEGATIVE  NEGATIVE mg/dL Final  . Hgb urine dipstick 06/12/2016 NEGATIVE  NEGATIVE Final  . Bilirubin Urine 06/12/2016 NEGATIVE  NEGATIVE Final  . Ketones, ur 06/12/2016 NEGATIVE  NEGATIVE mg/dL Final  . Protein, ur 06/12/2016 NEGATIVE  NEGATIVE mg/dL Final  . Nitrite 06/12/2016 NEGATIVE  NEGATIVE Final  . Leukocytes, UA 06/12/2016 NEGATIVE  NEGATIVE Final    Dg Ankle Complete Right  Result Date: 06/11/2016 CLINICAL DATA:  Pain without trauma. EXAM: RIGHT ANKLE - COMPLETE 3+ VIEW COMPARISON:  None. FINDINGS: There is no evidence of fracture, dislocation, or joint effusion. There is no evidence of arthropathy or other focal bone abnormality. Soft tissues are unremarkable. IMPRESSION: Negative. Electronically Signed   By: Dorise Bullion III M.D   On: 06/11/2016 13:56     Assessment/Plan   ICD-10-CM   1. Sprain of right foot, initial encounter S93.601A   2. Vascular dementia with behavioral disturbance F01.51   3. Essential hypertension, benign I10   4. Chronic obstructive pulmonary disease, unspecified COPD type ([place]) J44.9   5. Exocrine pancreatic insufficiency K86.81   6. Mixed hyperlipidemia E78.2   7. History of CVA (cerebrovascular accident) Z86.73   8. CKD (chronic kidney disease) stage 3, GFR 30-59 ml/min N18.3     Cont current meds as ordered  PT/OT/ST as ordered  GOAL: short term rehab with potential for long term care.  Communicated with pt and nursing.  Will follow  Vinnie Gombert S. Perlie Gold  [facility] [address] [place], [place] [address] [phone] Cell (Monday-Friday 8 AM - 5  PM) [phone] After 5 PM and follow prompts

## 2016-06-14 DIAGNOSIS — J449 Chronic obstructive pulmonary disease, unspecified: Secondary | ICD-10-CM | POA: Diagnosis not present

## 2016-06-14 DIAGNOSIS — F0281 Dementia in other diseases classified elsewhere with behavioral disturbance: Secondary | ICD-10-CM | POA: Diagnosis not present

## 2016-06-14 DIAGNOSIS — R0600 Dyspnea, unspecified: Secondary | ICD-10-CM | POA: Diagnosis not present

## 2016-06-14 DIAGNOSIS — I509 Heart failure, unspecified: Secondary | ICD-10-CM | POA: Diagnosis not present

## 2016-06-14 DIAGNOSIS — M25571 Pain in right ankle and joints of right foot: Secondary | ICD-10-CM | POA: Diagnosis not present

## 2016-06-14 DIAGNOSIS — I6340 Cerebral infarction due to embolism of unspecified cerebral artery: Secondary | ICD-10-CM | POA: Diagnosis not present

## 2016-06-15 ENCOUNTER — Telehealth: Payer: Self-pay

## 2016-06-15 DIAGNOSIS — R0600 Dyspnea, unspecified: Secondary | ICD-10-CM | POA: Diagnosis not present

## 2016-06-15 DIAGNOSIS — F0281 Dementia in other diseases classified elsewhere with behavioral disturbance: Secondary | ICD-10-CM | POA: Diagnosis not present

## 2016-06-15 DIAGNOSIS — J449 Chronic obstructive pulmonary disease, unspecified: Secondary | ICD-10-CM | POA: Diagnosis not present

## 2016-06-15 DIAGNOSIS — I509 Heart failure, unspecified: Secondary | ICD-10-CM | POA: Diagnosis not present

## 2016-06-15 DIAGNOSIS — I6340 Cerebral infarction due to embolism of unspecified cerebral artery: Secondary | ICD-10-CM | POA: Diagnosis not present

## 2016-06-15 DIAGNOSIS — M25571 Pain in right ankle and joints of right foot: Secondary | ICD-10-CM | POA: Diagnosis not present

## 2016-06-15 NOTE — Telephone Encounter (Signed)
This is a patient of [facility], who was admitted to [facility] after hospitalization. [facility] F/U is needed. Hospital discharge from [facility] on 06/12/16

## 2016-06-16 ENCOUNTER — Non-Acute Institutional Stay (SKILLED_NURSING_FACILITY): Payer: Medicare Other | Admitting: Adult Health

## 2016-06-16 ENCOUNTER — Encounter: Payer: Self-pay | Admitting: Adult Health

## 2016-06-16 DIAGNOSIS — R0600 Dyspnea, unspecified: Secondary | ICD-10-CM | POA: Diagnosis not present

## 2016-06-16 DIAGNOSIS — F0281 Dementia in other diseases classified elsewhere with behavioral disturbance: Secondary | ICD-10-CM | POA: Diagnosis not present

## 2016-06-16 DIAGNOSIS — J449 Chronic obstructive pulmonary disease, unspecified: Secondary | ICD-10-CM

## 2016-06-16 DIAGNOSIS — I10 Essential (primary) hypertension: Secondary | ICD-10-CM | POA: Diagnosis not present

## 2016-06-16 DIAGNOSIS — K8681 Exocrine pancreatic insufficiency: Secondary | ICD-10-CM | POA: Diagnosis not present

## 2016-06-16 DIAGNOSIS — M25571 Pain in right ankle and joints of right foot: Secondary | ICD-10-CM

## 2016-06-16 DIAGNOSIS — I6340 Cerebral infarction due to embolism of unspecified cerebral artery: Secondary | ICD-10-CM | POA: Diagnosis not present

## 2016-06-16 DIAGNOSIS — I509 Heart failure, unspecified: Secondary | ICD-10-CM | POA: Diagnosis not present

## 2016-06-16 NOTE — Progress Notes (Signed)
Location:   [place] Room Number: [place] of Service:  SNF (31)    CODE STATUS: Full Code  No Known Allergies  Chief Complaint  Patient presents with  . Discharge Note    Discharge to Home    HPI:  She had been admitted to this from the ED for right ankle pain. Fracture was ruled out. She was admitted for short term rehab and will return back home.  She will need home health for pt/ot/rn/cna/sw. She will need 3:1 commode; standard wheelchair. She will need to have her prescriptions written and will need to follow up with her medical provider.    Past Medical History:  Diagnosis Date  . CHF (congestive heart failure) ([place])   . COPD (chronic obstructive pulmonary disease) ([place])   . Dementia   . Heart attack ([place])   . Hypertension   . Murmur, heart   . Stroke [facility])     Past Surgical History:  Procedure Laterality Date  . ABDOMINAL HYSTERECTOMY     Partial     Social History   Social History  . Marital status: Single    Spouse name: N/A  . Number of children: 4  . Years of education: N/A   Occupational History  . retired    Social History Main Topics  . Smoking status: Former Smoker    Packs/day: 2.00    Years: 40.00    Types: Cigarettes    Quit date: 01/24/2006  . Smokeless tobacco: Never Used  . Alcohol use No  . Drug use: No  . Sexual activity: No   Other Topics Concern  . Not on file   Social History Narrative  . No narrative on file   Family History  Problem Relation Age of Onset  . Heart disease Mother   . Diabetes Mother   . Diabetes Father   . Breast cancer Daughter     VITAL SIGNS BP 110/62   Pulse 88   Temp 98.7 F (37.1 C)   Resp 18   Ht 5\' 2"  (1.575 m)   Wt 155 lb 3.2 oz (70.4 kg)   SpO2 95%   BMI 28.39 kg/m   Patient's Medications  New Prescriptions   No medications on file  Previous Medications   ACETAMINOPHEN (TYLENOL) 500 MG TABLET    Take 1,000 mg by mouth 3 (three) times daily.   ALBUTEROL  (PROVENTIL HFA;VENTOLIN HFA) 108 (90 BASE) MCG/ACT INHALER    Inhale 2 puffs into the lungs every 6 (six) hours as needed for wheezing or shortness of breath.   ATORVASTATIN (LIPITOR) 10 MG TABLET    Take 10 mg by mouth every evening.   CARVEDILOL (COREG) 25 MG TABLET    TAKE 1 TABLET (25 MG TOTAL) BY MOUTH 2 (TWO) TIMES DAILY WITH A MEAL.   CHLORTHALIDONE (HYGROTON) 25 MG TABLET    TAKE 1 TABLET (25 MG TOTAL) BY MOUTH EVERY MORNING.   CREON 24000-76000 UNITS CPEP    TAKE ONE CAPSULE BY MOUTH 3 TIMES A DAY WITH MEALS   DICLOFENAC SODIUM (VOLTAREN) 1 % GEL    Apply 2 g topically daily.   KLOR-CON M20 20 MEQ TABLET    TAKE 1 TABLET EVERY DAY ON MON,WED,FRI AND SAT   LISINOPRIL (PRINIVIL,ZESTRIL) 40 MG TABLET    Take 1 tablet (40 mg total) by mouth daily.   MEMANTINE HCL-DONEPEZIL HCL (NAMZARIC) 28-10 MG CP24    Use as directed 1 capsule in the mouth  or throat daily.   TIOTROPIUM (SPIRIVA HANDIHALER) 18 MCG INHALATION CAPSULE    PLACE 1 CAPSULE INTO INHALER AND INHALE DAILY  Modified Medications   No medications on file  Discontinued Medications   SIMVASTATIN (ZOCOR) 20 MG TABLET    TAKE ONE TABLET BY MOUTH ONCE DAILY IN THE EVENING     SIGNIFICANT DIAGNOSTIC EXAMS  06-11-16: right ankle x-ray: There is no evidence of fracture, dislocation, or joint effusion. There is no evidence of arthropathy or other focal bone abnormality. Soft tissues are unremarkable.    LABS REVIEWED:   06-11-16: wbc 15.0; hgb 13.1; hct 43.4; mcv 88.9 ;plt 239; glucose 116; bun 49; creat 1.97; k+ 5.2; na++ 147     Review of Systems  Constitutional: Negative for malaise/fatigue.  Respiratory: Negative for cough and shortness of breath.   Cardiovascular: Negative for chest pain, palpitations and leg swelling.  Gastrointestinal: Negative for abdominal pain, constipation and heartburn.  Musculoskeletal: Negative for back pain, joint pain and myalgias.  Skin: Negative.   Neurological: Negative for dizziness.    Psychiatric/Behavioral: The patient is not nervous/anxious.     Physical Exam  Constitutional: No distress.  Eyes: Conjunctivae are normal.  Neck: Neck supple. No JVD present. No thyromegaly present.  Cardiovascular: Normal rate, regular rhythm and intact distal pulses.   Respiratory: Effort normal and breath sounds normal. No respiratory distress. She has no wheezes.  GI: Soft. Bowel sounds are normal. She exhibits no distension. There is no tenderness.  Musculoskeletal: She exhibits no edema.  Able to move all extremities   Lymphadenopathy:    She has no cervical adenopathy.  Neurological: She is alert.  Skin: Skin is warm and dry. She is not diaphoretic.  Psychiatric: She has a normal mood and affect.     ASSESSMENT/ PLAN:  Patient is being discharged with the following home health services:  Pt/ot/rn/cna/sw: to evaluate and treat as indicated for gait balance strength adl training medication management adl care and community services  Patient is being discharged with the following durable medical equipment:  3:1 commode. Standard wheelchair in order to maintain her current level of independence with her adls; which cannot be achieved with a walker; she can self propel.   Patient has been advised to f/u with their PCP in 1-2 weeks to bring them up to date on their rehab stay.  Social services at facility was responsible for arranging this appointment.  Pt was provided with a 30 day supply of prescriptions for medications and refills must be obtained from their PCP.  For controlled substances, a more limited supply may be provided adequate until PCP appointment only.   Time spent with patient  45  minutes >50% time spent counseling; reviewing medical record; tests; labs; and developing future plan of care    Ok Edwards NP [facility]  Contact [phone] Monday through Friday 8am- 5pm  After hours call [phone]

## 2016-06-19 DIAGNOSIS — J449 Chronic obstructive pulmonary disease, unspecified: Secondary | ICD-10-CM | POA: Diagnosis not present

## 2016-06-19 DIAGNOSIS — M25571 Pain in right ankle and joints of right foot: Secondary | ICD-10-CM | POA: Diagnosis not present

## 2016-06-19 DIAGNOSIS — I509 Heart failure, unspecified: Secondary | ICD-10-CM | POA: Diagnosis not present

## 2016-06-19 DIAGNOSIS — I110 Hypertensive heart disease with heart failure: Secondary | ICD-10-CM | POA: Diagnosis not present

## 2016-06-19 DIAGNOSIS — K8681 Exocrine pancreatic insufficiency: Secondary | ICD-10-CM | POA: Diagnosis not present

## 2016-06-19 DIAGNOSIS — Z8673 Personal history of transient ischemic attack (TIA), and cerebral infarction without residual deficits: Secondary | ICD-10-CM | POA: Diagnosis not present

## 2016-06-19 DIAGNOSIS — F0151 Vascular dementia with behavioral disturbance: Secondary | ICD-10-CM | POA: Diagnosis not present

## 2016-06-19 DIAGNOSIS — Z9181 History of falling: Secondary | ICD-10-CM | POA: Diagnosis not present

## 2016-06-19 DIAGNOSIS — E785 Hyperlipidemia, unspecified: Secondary | ICD-10-CM | POA: Diagnosis not present

## 2016-06-20 ENCOUNTER — Ambulatory Visit: Payer: Medicare Other | Admitting: Nurse Practitioner

## 2016-06-21 DIAGNOSIS — J449 Chronic obstructive pulmonary disease, unspecified: Secondary | ICD-10-CM | POA: Diagnosis not present

## 2016-06-21 DIAGNOSIS — I509 Heart failure, unspecified: Secondary | ICD-10-CM | POA: Diagnosis not present

## 2016-06-21 DIAGNOSIS — E785 Hyperlipidemia, unspecified: Secondary | ICD-10-CM | POA: Diagnosis not present

## 2016-06-21 DIAGNOSIS — M25571 Pain in right ankle and joints of right foot: Secondary | ICD-10-CM | POA: Diagnosis not present

## 2016-06-21 DIAGNOSIS — I110 Hypertensive heart disease with heart failure: Secondary | ICD-10-CM | POA: Diagnosis not present

## 2016-06-21 DIAGNOSIS — F0151 Vascular dementia with behavioral disturbance: Secondary | ICD-10-CM | POA: Diagnosis not present

## 2016-06-27 ENCOUNTER — Encounter: Payer: Self-pay | Admitting: Nurse Practitioner

## 2016-06-27 DIAGNOSIS — E785 Hyperlipidemia, unspecified: Secondary | ICD-10-CM | POA: Diagnosis not present

## 2016-06-27 DIAGNOSIS — I110 Hypertensive heart disease with heart failure: Secondary | ICD-10-CM | POA: Diagnosis not present

## 2016-06-27 DIAGNOSIS — J449 Chronic obstructive pulmonary disease, unspecified: Secondary | ICD-10-CM | POA: Diagnosis not present

## 2016-06-27 DIAGNOSIS — I509 Heart failure, unspecified: Secondary | ICD-10-CM | POA: Diagnosis not present

## 2016-06-27 DIAGNOSIS — F0151 Vascular dementia with behavioral disturbance: Secondary | ICD-10-CM | POA: Diagnosis not present

## 2016-06-27 DIAGNOSIS — M25571 Pain in right ankle and joints of right foot: Secondary | ICD-10-CM | POA: Diagnosis not present

## 2016-06-28 DIAGNOSIS — J449 Chronic obstructive pulmonary disease, unspecified: Secondary | ICD-10-CM | POA: Diagnosis not present

## 2016-06-28 DIAGNOSIS — I110 Hypertensive heart disease with heart failure: Secondary | ICD-10-CM | POA: Diagnosis not present

## 2016-06-28 DIAGNOSIS — I509 Heart failure, unspecified: Secondary | ICD-10-CM | POA: Diagnosis not present

## 2016-06-28 DIAGNOSIS — M25571 Pain in right ankle and joints of right foot: Secondary | ICD-10-CM | POA: Diagnosis not present

## 2016-06-28 DIAGNOSIS — F0151 Vascular dementia with behavioral disturbance: Secondary | ICD-10-CM | POA: Diagnosis not present

## 2016-06-28 DIAGNOSIS — E785 Hyperlipidemia, unspecified: Secondary | ICD-10-CM | POA: Diagnosis not present

## 2016-06-29 ENCOUNTER — Ambulatory Visit (INDEPENDENT_AMBULATORY_CARE_PROVIDER_SITE_OTHER): Payer: Medicare Other | Admitting: Nurse Practitioner

## 2016-06-29 ENCOUNTER — Telehealth: Payer: Self-pay

## 2016-06-29 ENCOUNTER — Encounter: Payer: Self-pay | Admitting: Nurse Practitioner

## 2016-06-29 VITALS — BP 124/78 | HR 84 | Temp 98.2°F | Resp 17 | Ht 62.0 in | Wt 158.6 lb

## 2016-06-29 DIAGNOSIS — I509 Heart failure, unspecified: Secondary | ICD-10-CM | POA: Diagnosis not present

## 2016-06-29 DIAGNOSIS — M25571 Pain in right ankle and joints of right foot: Secondary | ICD-10-CM | POA: Diagnosis not present

## 2016-06-29 DIAGNOSIS — I10 Essential (primary) hypertension: Secondary | ICD-10-CM

## 2016-06-29 DIAGNOSIS — J449 Chronic obstructive pulmonary disease, unspecified: Secondary | ICD-10-CM | POA: Diagnosis not present

## 2016-06-29 DIAGNOSIS — E785 Hyperlipidemia, unspecified: Secondary | ICD-10-CM | POA: Diagnosis not present

## 2016-06-29 DIAGNOSIS — F0151 Vascular dementia with behavioral disturbance: Secondary | ICD-10-CM

## 2016-06-29 DIAGNOSIS — R0902 Hypoxemia: Secondary | ICD-10-CM

## 2016-06-29 DIAGNOSIS — I639 Cerebral infarction, unspecified: Secondary | ICD-10-CM

## 2016-06-29 DIAGNOSIS — I110 Hypertensive heart disease with heart failure: Secondary | ICD-10-CM | POA: Diagnosis not present

## 2016-06-29 DIAGNOSIS — E782 Mixed hyperlipidemia: Secondary | ICD-10-CM | POA: Diagnosis not present

## 2016-06-29 DIAGNOSIS — K8681 Exocrine pancreatic insufficiency: Secondary | ICD-10-CM | POA: Diagnosis not present

## 2016-06-29 DIAGNOSIS — F01518 Vascular dementia, unspecified severity, with other behavioral disturbance: Secondary | ICD-10-CM

## 2016-06-29 LAB — LIPID PANEL
CHOL/HDL RATIO: 2.2 ratio (ref <5.0)
Cholesterol: 171 mg/dL (ref <200)
HDL: 79 mg/dL (ref >50)
LDL CALC: 75 mg/dL (ref <100)
TRIGLYCERIDES: 87 mg/dL (ref <150)
VLDL: 17 mg/dL (ref <30)

## 2016-06-29 LAB — CBC WITH DIFFERENTIAL/PLATELET
Basophils Absolute: 0 cells/uL (ref 0–200)
Basophils Relative: 0 %
Eosinophils Absolute: 88 cells/uL (ref 15–500)
Eosinophils Relative: 1 %
HEMATOCRIT: 40.2 % (ref 35.0–45.0)
HEMOGLOBIN: 12.4 g/dL (ref 11.7–15.5)
LYMPHS ABS: 2200 {cells}/uL (ref 850–3900)
Lymphocytes Relative: 25 %
MCH: 25.7 pg — ABNORMAL LOW (ref 27.0–33.0)
MCHC: 30.8 g/dL — AB (ref 32.0–36.0)
MCV: 83.2 fL (ref 80.0–100.0)
MONO ABS: 704 {cells}/uL (ref 200–950)
MPV: 10.2 fL (ref 7.5–12.5)
Monocytes Relative: 8 %
NEUTROS PCT: 66 %
Neutro Abs: 5808 cells/uL (ref 1500–7800)
Platelets: 294 10*3/uL (ref 140–400)
RBC: 4.83 MIL/uL (ref 3.80–5.10)
RDW: 17.5 % — ABNORMAL HIGH (ref 11.0–15.0)
WBC: 8.8 10*3/uL (ref 3.8–10.8)

## 2016-06-29 LAB — COMPLETE METABOLIC PANEL WITH GFR
ALT: 9 U/L (ref 6–29)
AST: 14 U/L (ref 10–35)
Albumin: 3.9 g/dL (ref 3.6–5.1)
Alkaline Phosphatase: 62 U/L (ref 33–130)
BUN: 21 mg/dL (ref 7–25)
CHLORIDE: 105 mmol/L (ref 98–110)
CO2: 25 mmol/L (ref 20–31)
CREATININE: 1.25 mg/dL — AB (ref 0.60–0.93)
Calcium: 9.5 mg/dL (ref 8.6–10.4)
GFR, EST AFRICAN AMERICAN: 49 mL/min — AB (ref >=60)
GFR, EST NON AFRICAN AMERICAN: 42 mL/min — AB (ref >=60)
Glucose, Bld: 116 mg/dL — ABNORMAL HIGH (ref 65–99)
POTASSIUM: 4.2 mmol/L (ref 3.5–5.3)
Sodium: 142 mmol/L (ref 135–146)
Total Bilirubin: 0.4 mg/dL (ref 0.2–1.2)
Total Protein: 7.9 g/dL (ref 6.1–8.1)

## 2016-06-29 NOTE — Patient Instructions (Signed)
To make follow up with Cardiologist- please have them send [place] your records  May go to [facility] to cut toe nails  Will place referral for orthopedic and for pulmonary

## 2016-06-29 NOTE — Telephone Encounter (Signed)
I called Dahlia Client, rep for [facility], to see about getting patient on home oxygen.   Left a message for Melissa to call the office.

## 2016-06-29 NOTE — Progress Notes (Addendum)
Careteam: Patient Care Team: Lauree Chandler, NP as PCP - General (Nurse Practitioner) Adrian Prows, MD as Consulting Physician (Cardiology)  Advanced Directive information Does Patient Have a Medical Advance Directive?: No, Would patient like information on creating a medical advance directive?: Yes (MAU/Ambulatory/Procedural Areas - Information given)  No Known Allergies  Chief Complaint  Patient presents with  . Medical Management of Chronic Issues    Pt is being seen for a 4 month routine visit. Pt was recently discharged from [facility] on 06/11/16.      HPI: Patient is a 76 y.o. female seen in the office today for routine and rehab follow up. Pt with hx of CHF, HTN, COPD, hyperlipidemia, exocrine pancreatic insufficiency, dementia and ankle pain.  Pt was admitted to SNF for rehab after ED visit for ankle pain, there was no injury and fracture was ruled out. Family desire SNF placement for rehab. Daughter did not feel like she was getting the assistance she needed at SNF so she took her home.  Getting home health [facility].  She was discharged home with daughter on  06/16/16. Home health feels like she needs O2 due to increase shortness of breath on exertion  Home health reports O2 was below 90% PT wants her to have shower chair and walker.  Also needs home aid- needing one for 8 hours and [facility] is going to help arrange this.  No longer going to day program but goal is to get her back there.   HTN - BP stable on lisinopril, coreg, chlorthalidone with Klor-con 4 days per week  Exocrine pancreatic insufficiency - stable on creon  Dementia - vascular type. Ongoing decline, requiring more assistance, more incontinence, more prompting for activities. She takes namzaric daily. Albumin 4.0  COPD - on albuterol HFA prn (using 3 times a day every day) and spiriva daily,   Hyperlipidemia - stable on zocor. LDL 65 in aug 2017  CHF- stable, without changes in edema or acute  weight gain. Reports more shortness of breath and drop in O2 with ambulation.  conts on lisinopril, coreg and chlorthalidone.  Following with Dr Einar Gip, has been over a year since last visit.   Review of Systems:  Review of Systems  Constitutional: Negative for chills, fever, malaise/fatigue and weight loss.  HENT: Negative for congestion, hearing loss and sinus pain.   Eyes: Negative for blurred vision.  Respiratory: Positive for shortness of breath. Negative for cough and sputum production.   Cardiovascular: Negative for chest pain, palpitations and leg swelling.  Gastrointestinal: Negative for abdominal pain, blood in stool, constipation, diarrhea and melena.  Genitourinary: Positive for urgency. Negative for dysuria, flank pain, frequency and hematuria.       Incontinence  Musculoskeletal: Positive for joint pain and myalgias. Negative for falls.  Skin: Negative for itching and rash.  Neurological: Positive for weakness. Negative for dizziness, sensory change, speech change, focal weakness and loss of consciousness.  Endo/Heme/Allergies: Does not bruise/bleed easily.  Psychiatric/Behavioral: Positive for memory loss. Negative for depression. The patient has insomnia. The patient is not nervous/anxious.     Past Medical History:  Diagnosis Date  . CHF (congestive heart failure) ([place])   . COPD (chronic obstructive pulmonary disease) ([place])   . Dementia   . Heart attack ([place])   . Hypertension   . Murmur, heart   . Stroke [facility])    Past Surgical History:  Procedure Laterality Date  . ABDOMINAL HYSTERECTOMY     Partial  Social History:   reports that she quit smoking about 10 years ago. Her smoking use included Cigarettes. She has a 80.00 pack-year smoking history. She has never used smokeless tobacco. She reports that she does not drink alcohol or use drugs.  Family History  Problem Relation Age of Onset  . Heart disease Mother   . Diabetes Mother   . Diabetes Father   .  Breast cancer Daughter     Medications: Patient's Medications  New Prescriptions   No medications on file  Previous Medications   ACETAMINOPHEN (TYLENOL) 500 MG TABLET    Take 1,000 mg by mouth 3 (three) times daily.   ALBUTEROL (PROVENTIL HFA;VENTOLIN HFA) 108 (90 BASE) MCG/ACT INHALER    Inhale 2 puffs into the lungs every 6 (six) hours as needed for wheezing or shortness of breath.   ATORVASTATIN (LIPITOR) 10 MG TABLET    Take 10 mg by mouth every evening.   CARVEDILOL (COREG) 25 MG TABLET    TAKE 1 TABLET (25 MG TOTAL) BY MOUTH 2 (TWO) TIMES DAILY WITH A MEAL.   CHLORTHALIDONE (HYGROTON) 25 MG TABLET    TAKE 1 TABLET (25 MG TOTAL) BY MOUTH EVERY MORNING.   CREON 24000-76000 UNITS CPEP    TAKE ONE CAPSULE BY MOUTH 3 TIMES A DAY WITH MEALS   DICLOFENAC SODIUM (VOLTAREN) 1 % GEL    Apply 2 g topically daily.   KLOR-CON M20 20 MEQ TABLET    TAKE 1 TABLET EVERY DAY ON MON,WED,FRI AND SAT   LISINOPRIL (PRINIVIL,ZESTRIL) 40 MG TABLET    Take 1 tablet (40 mg total) by mouth daily.   MEMANTINE HCL-DONEPEZIL HCL (NAMZARIC) 28-10 MG CP24    Use as directed 1 capsule in the mouth or throat daily.   TIOTROPIUM (SPIRIVA HANDIHALER) 18 MCG INHALATION CAPSULE    PLACE 1 CAPSULE INTO INHALER AND INHALE DAILY  Modified Medications   No medications on file  Discontinued Medications   No medications on file     Physical Exam:  Vitals:   06/29/16 0857  BP: 124/78  Pulse: 84  Resp: 17  Temp: 98.2 F (36.8 C)  TempSrc: Oral  SpO2: 93%  Weight: 158 lb 9.6 oz (71.9 kg)  Height: _0  (1.575 m)   Body mass index is 29.01 kg/m.  Physical Exam  Constitutional: She appears well-developed.  Frail appearing sitting in w/c in NAD  HENT:  Mouth/Throat: Oropharynx is clear and moist. No oropharyngeal exudate.  Eyes: Pupils are equal, round, and reactive to light. No scleral icterus.  Neck: Neck supple. Carotid bruit is not present. No tracheal deviation present. No thyromegaly present.    Cardiovascular: Normal rate and regular rhythm.  Exam reveals no gallop and no friction rub.   Murmur (1/6 SEM) heard.    Pulmonary/Chest: Effort normal. No stridor. No respiratory distress. She has decreased breath sounds (throughtout ). She has no wheezes. She has no rales.  Abdominal: Soft. Bowel sounds are normal. She exhibits no distension and no mass. There is no hepatomegaly. There is no tenderness. There is no rebound and no guarding.  Musculoskeletal: She exhibits tenderness. She exhibits no edema.  Right dorsal foot and medial ankle tenderness, no edema, reduced ROM.   Lymphadenopathy:    She has no cervical adenopathy.  Neurological: She is alert.  Plantarflexion strength 4/5 on right; oriented to name only  Skin: Skin is warm and dry. No rash noted.  Psychiatric: She has a normal mood and affect. Her behavior is  normal.   Labs reviewed: Basic Metabolic Panel:  Recent Labs  08/26/15 1454 06/11/16 1410  NA 141 147*  K 4.2 5.2*  CL 103 110  CO2 27 23  GLUCOSE 82 116*  BUN 18 49*  CREATININE 1.06* 1.97*  CALCIUM 9.7 9.6   Liver Function Tests:  Recent Labs  08/26/15 1454  AST 15  ALT 9  ALKPHOS 56  BILITOT 0.5  PROT 7.9  ALBUMIN 4.0   No results for input(s): LIPASE, AMYLASE in the last 8760 hours. No results for input(s): AMMONIA in the last 8760 hours. CBC:  Recent Labs  08/26/15 1454 06/11/16 1410  WBC 6.9 15.0*  NEUTROABS 4,071 11.5*  HGB 12.2 13.1  HCT 39.7 43.4  MCV 83.2 88.9  PLT 285 239   Lipid Panel:  Recent Labs  08/26/15 1454  CHOL 162  HDL 80  LDLCALC 65  TRIG 86  CHOLHDL 2.0   TSH: No results for input(s): TSH in the last 8760 hours. A1C: No results found for: HGBA1C   Assessment/Plan 1. Mixed hyperlipidemia On lipitor; LDL 65 last year.  - Lipid Panel - CMP with eGFR  2. Vascular dementia with behavioral disturbance conts on namzaric, daughter notes gradual decline in cognitive and functional status. Will cont  current regimen. Currently with home health. Needing increase assistance and 24/7 care.   3. Exocrine pancreatic insufficiency Stable on creon  4. Chronic obstructive pulmonary disease, unspecified COPD type ([place]) -remains on spiriva and albuterol. Pt conts to have shortness of breath despite treatment with Spiriva requiring frequent use of albuterol.  Despite use of albuterol o2 sats also have dropped. She has not had PFTs  will get referral to Pulmonology at this time.   5. Essential hypertension -stable. conts on coreg, chlorthalidone and lisinopril  - CBC with Differential/Platelets - CMP with eGFR  6. Acute right ankle pain -ongoing, PT following at home. Has not been evaluated by ortho since ED visit 5/20 - Ambulatory referral to Orthopedics for further evaluation.   7. Hypoxia pts O2 was 93% on room air- when she started walking dropped to 84% on room air and then increased to 96% on 2L o2 -O2 at 2L ordered.    Total time 40 mins:  time greater than 50% of total time spent doing chart review, counseling and coordination of care.  Carlos American. Harle Battiest  [facility] [phone] 8 am - 5 pm) [phone] (after hours)

## 2016-06-30 DIAGNOSIS — J449 Chronic obstructive pulmonary disease, unspecified: Secondary | ICD-10-CM | POA: Diagnosis not present

## 2016-06-30 DIAGNOSIS — I110 Hypertensive heart disease with heart failure: Secondary | ICD-10-CM | POA: Diagnosis not present

## 2016-06-30 DIAGNOSIS — M25571 Pain in right ankle and joints of right foot: Secondary | ICD-10-CM | POA: Diagnosis not present

## 2016-06-30 DIAGNOSIS — F0151 Vascular dementia with behavioral disturbance: Secondary | ICD-10-CM | POA: Diagnosis not present

## 2016-06-30 DIAGNOSIS — I509 Heart failure, unspecified: Secondary | ICD-10-CM | POA: Diagnosis not present

## 2016-06-30 DIAGNOSIS — E785 Hyperlipidemia, unspecified: Secondary | ICD-10-CM | POA: Diagnosis not present

## 2016-07-03 DIAGNOSIS — I110 Hypertensive heart disease with heart failure: Secondary | ICD-10-CM | POA: Diagnosis not present

## 2016-07-03 DIAGNOSIS — E785 Hyperlipidemia, unspecified: Secondary | ICD-10-CM | POA: Diagnosis not present

## 2016-07-03 DIAGNOSIS — J449 Chronic obstructive pulmonary disease, unspecified: Secondary | ICD-10-CM | POA: Diagnosis not present

## 2016-07-03 DIAGNOSIS — F0151 Vascular dementia with behavioral disturbance: Secondary | ICD-10-CM | POA: Diagnosis not present

## 2016-07-03 DIAGNOSIS — I509 Heart failure, unspecified: Secondary | ICD-10-CM | POA: Diagnosis not present

## 2016-07-03 DIAGNOSIS — M25571 Pain in right ankle and joints of right foot: Secondary | ICD-10-CM | POA: Diagnosis not present

## 2016-07-03 NOTE — Telephone Encounter (Signed)
I called [place] and spoke with [facility]. She stated that the order was received but she was unsure of why it was not processed. She stated that she would begin processing the order right away as an urgent need. She will call me if she runs into any issues with the order.   She stated that as long as there were no issues, the oxygen would be to patient by this afternoon.   I called Harlan Stains with [facility] to let her know.   I also called patient to let her know.

## 2016-07-03 NOTE — Telephone Encounter (Signed)
Phone number for Harlan Stains with [facility] is [phone]

## 2016-07-03 NOTE — Telephone Encounter (Signed)
Note faxed to [phone]

## 2016-07-03 NOTE — Telephone Encounter (Signed)
Carol Monroe with [facility] called and wanted you to know that patient's SpO2 was 82% this morning with No O2 and when patient walks it drops into the 70's. Nurse stated that she called [facility] and they have never received an order for Oxygen for patient. Please Advise.  Also requested verbal order for Education officer, museum, Given verbal.

## 2016-07-03 NOTE — Telephone Encounter (Signed)
Daria with [place] called to inform Coralyn Mark and Dani Gobble that the documentation for medicare guidelines in incomplete for patient to receive oxygen.  1.) Addendum will need to be made to OV note indicating alternate method (Albuterol) was insufficient for SOB management.  2.) Saturation level at rest needs to be documented, if the 93 % was at rest an addendum needs to be made to the note indicating.  3.) The 84 % O2 level needs to indicate if it was on room air or oxygen.   Bryson Ha will fax a medicare guidelines form for future reference for [facility] providers.  Return Number for Bryson Ha is [phone]

## 2016-07-03 NOTE — Telephone Encounter (Signed)
Done, note has been changed

## 2016-07-04 ENCOUNTER — Ambulatory Visit (INDEPENDENT_AMBULATORY_CARE_PROVIDER_SITE_OTHER): Payer: Medicare Other | Admitting: Orthopaedic Surgery

## 2016-07-05 DIAGNOSIS — I110 Hypertensive heart disease with heart failure: Secondary | ICD-10-CM | POA: Diagnosis not present

## 2016-07-05 DIAGNOSIS — M25571 Pain in right ankle and joints of right foot: Secondary | ICD-10-CM | POA: Diagnosis not present

## 2016-07-05 DIAGNOSIS — J449 Chronic obstructive pulmonary disease, unspecified: Secondary | ICD-10-CM | POA: Diagnosis not present

## 2016-07-05 DIAGNOSIS — F0151 Vascular dementia with behavioral disturbance: Secondary | ICD-10-CM | POA: Diagnosis not present

## 2016-07-05 DIAGNOSIS — E785 Hyperlipidemia, unspecified: Secondary | ICD-10-CM | POA: Diagnosis not present

## 2016-07-05 DIAGNOSIS — I509 Heart failure, unspecified: Secondary | ICD-10-CM | POA: Diagnosis not present

## 2016-07-06 ENCOUNTER — Ambulatory Visit (INDEPENDENT_AMBULATORY_CARE_PROVIDER_SITE_OTHER): Payer: Medicare Other | Admitting: Orthopaedic Surgery

## 2016-07-06 DIAGNOSIS — M25571 Pain in right ankle and joints of right foot: Secondary | ICD-10-CM | POA: Diagnosis not present

## 2016-07-06 DIAGNOSIS — J449 Chronic obstructive pulmonary disease, unspecified: Secondary | ICD-10-CM | POA: Diagnosis not present

## 2016-07-06 DIAGNOSIS — E785 Hyperlipidemia, unspecified: Secondary | ICD-10-CM | POA: Diagnosis not present

## 2016-07-06 DIAGNOSIS — I509 Heart failure, unspecified: Secondary | ICD-10-CM | POA: Diagnosis not present

## 2016-07-06 DIAGNOSIS — I110 Hypertensive heart disease with heart failure: Secondary | ICD-10-CM | POA: Diagnosis not present

## 2016-07-06 DIAGNOSIS — F0151 Vascular dementia with behavioral disturbance: Secondary | ICD-10-CM | POA: Diagnosis not present

## 2016-07-07 DIAGNOSIS — J449 Chronic obstructive pulmonary disease, unspecified: Secondary | ICD-10-CM | POA: Diagnosis not present

## 2016-07-07 DIAGNOSIS — I509 Heart failure, unspecified: Secondary | ICD-10-CM | POA: Diagnosis not present

## 2016-07-07 DIAGNOSIS — E785 Hyperlipidemia, unspecified: Secondary | ICD-10-CM | POA: Diagnosis not present

## 2016-07-07 DIAGNOSIS — I110 Hypertensive heart disease with heart failure: Secondary | ICD-10-CM | POA: Diagnosis not present

## 2016-07-07 DIAGNOSIS — M25571 Pain in right ankle and joints of right foot: Secondary | ICD-10-CM | POA: Diagnosis not present

## 2016-07-07 DIAGNOSIS — F0151 Vascular dementia with behavioral disturbance: Secondary | ICD-10-CM | POA: Diagnosis not present

## 2016-07-10 DIAGNOSIS — E785 Hyperlipidemia, unspecified: Secondary | ICD-10-CM | POA: Diagnosis not present

## 2016-07-10 DIAGNOSIS — M25571 Pain in right ankle and joints of right foot: Secondary | ICD-10-CM | POA: Diagnosis not present

## 2016-07-10 DIAGNOSIS — I110 Hypertensive heart disease with heart failure: Secondary | ICD-10-CM | POA: Diagnosis not present

## 2016-07-10 DIAGNOSIS — F0151 Vascular dementia with behavioral disturbance: Secondary | ICD-10-CM | POA: Diagnosis not present

## 2016-07-10 DIAGNOSIS — J449 Chronic obstructive pulmonary disease, unspecified: Secondary | ICD-10-CM | POA: Diagnosis not present

## 2016-07-10 DIAGNOSIS — I509 Heart failure, unspecified: Secondary | ICD-10-CM | POA: Diagnosis not present

## 2016-07-11 ENCOUNTER — Encounter (INDEPENDENT_AMBULATORY_CARE_PROVIDER_SITE_OTHER): Payer: Self-pay | Admitting: Orthopaedic Surgery

## 2016-07-11 ENCOUNTER — Ambulatory Visit (INDEPENDENT_AMBULATORY_CARE_PROVIDER_SITE_OTHER): Payer: Medicare Other | Admitting: Orthopaedic Surgery

## 2016-07-11 DIAGNOSIS — I639 Cerebral infarction, unspecified: Secondary | ICD-10-CM | POA: Diagnosis not present

## 2016-07-11 DIAGNOSIS — M25571 Pain in right ankle and joints of right foot: Secondary | ICD-10-CM

## 2016-07-11 MED ORDER — PREDNISONE 10 MG (21) PO TBPK: ORAL_TABLET | ORAL | 0 refills | Status: DC

## 2016-07-11 NOTE — Progress Notes (Signed)
Office Visit Note   Patient: Carol Monroe           Date of Birth: [birth year]           MRN: [id number] Visit Date: 07/11/2016              Requested by: Lauree Chandler, NP [place], [place] [address] PCP: Lauree Chandler, NP   Assessment & Plan: Visit Diagnoses:  1. 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 I recommend Cam Walker for at least 2 weeks and then wean as tolerated. Prednisone taper was given. Patient not a good candidate for NSAIDs. Follow-up with me as needed.  Follow-Up Instructions: Return if symptoms worsen or fail to improve.   Orders:  No orders of the defined types were placed in this encounter.  Meds ordered this encounter  Medications  . predniSONE (STERAPRED UNI-PAK 21 TAB) 10 MG (21) TBPK tablet    Sig: Take as directed    Dispense:  21 tablet    Refill:  0      Procedures: No procedures performed   Clinical Data: No additional findings.   Subjective: Chief Complaint  Patient presents with  . Right Ankle - Pain    Patient is a 76 year old female comes in with right ankle pain. She has a history of stroke and dementia and congestive heart failure. She is on chronic oxygen for COPD. She is taking Tylenol with no relief. Pain does not radiate. She is a poor historian.    Review of Systems  Constitutional: Negative.   HENT: Negative.   Eyes: Negative.   Respiratory: Negative.   Cardiovascular: Negative.   Endocrine: Negative.   Musculoskeletal: Negative.   Neurological: Negative.   Hematological: Negative.   Psychiatric/Behavioral: Negative.   All other systems reviewed and are negative.    Objective: Vital Signs: There were no vitals taken for this visit.  Physical Exam  Constitutional: She is oriented to person, place, and time. She appears well-developed and well-nourished.  HENT:  Head: Normocephalic and atraumatic.  Eyes: EOM are  normal.  Neck: Neck supple.  Pulmonary/Chest: Effort normal.  Abdominal: Soft.  Neurological: She is alert and oriented to person, place, and time.  Skin: Skin is warm. Capillary refill takes less than 2 seconds.  Psychiatric: She has a normal mood and affect. Her behavior is normal. Judgment and thought content normal.  Nursing note and vitals reviewed.   Ortho Exam Right foot and ankle exam shows no swelling or warmth. 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 She endorses pain throughout her ankle and foot. Specialty Comments:  No specialty comments available.  Imaging: No results found.   PMFS History: Patient Active Problem List   Diagnosis Date Noted  . Pain in right ankle and joints of right foot 07/11/2016  . Acute right ankle pain 06/16/2016  . Urinary incontinence 04/09/2014  . Special screening for malignant neoplasms, colon 12/25/2013  . Dyspnea 12/03/2013  . Hyperlipidemia 07/10/2013  . Exocrine pancreatic insufficiency 04/10/2013  . Vascular dementia with behavioral disturbance 04/10/2013  . Essential hypertension, benign 04/10/2013  . Cerumen impaction 04/24/2012  . Dementia   . Hypertension   . Stroke ([place])   . COPD (chronic obstructive pulmonary disease) ([place])   . CHF (congestive heart failure) ([facility])    Past Medical History:  Diagnosis Date  . CHF (congestive heart failure) ([place])   .  COPD (chronic obstructive pulmonary disease) ([place])   . Dementia   . Heart attack ([place])   . Hypertension   . Murmur, heart   . Stroke [facility])     Family History  Problem Relation Age of Onset  . Heart disease Mother   . Diabetes Mother   . Diabetes Father   . Breast cancer Daughter     Past Surgical History:  Procedure Laterality Date  . ABDOMINAL HYSTERECTOMY     Partial    Social History   Occupational History  . retired    Social History Main Topics  . Smoking status: Former Smoker    Packs/day: 2.00    Years: 40.00    Types: Cigarettes    Quit date:  01/24/2006  . Smokeless tobacco: Never Used  . Alcohol use No  . Drug use: No  . Sexual activity: No

## 2016-07-13 DIAGNOSIS — J449 Chronic obstructive pulmonary disease, unspecified: Secondary | ICD-10-CM | POA: Diagnosis not present

## 2016-07-13 DIAGNOSIS — E785 Hyperlipidemia, unspecified: Secondary | ICD-10-CM | POA: Diagnosis not present

## 2016-07-13 DIAGNOSIS — F0151 Vascular dementia with behavioral disturbance: Secondary | ICD-10-CM | POA: Diagnosis not present

## 2016-07-13 DIAGNOSIS — M25571 Pain in right ankle and joints of right foot: Secondary | ICD-10-CM | POA: Diagnosis not present

## 2016-07-13 DIAGNOSIS — I110 Hypertensive heart disease with heart failure: Secondary | ICD-10-CM | POA: Diagnosis not present

## 2016-07-13 DIAGNOSIS — I509 Heart failure, unspecified: Secondary | ICD-10-CM | POA: Diagnosis not present

## 2016-07-14 ENCOUNTER — Telehealth: Payer: Self-pay | Admitting: *Deleted

## 2016-07-14 DIAGNOSIS — M25571 Pain in right ankle and joints of right foot: Secondary | ICD-10-CM | POA: Diagnosis not present

## 2016-07-14 DIAGNOSIS — E785 Hyperlipidemia, unspecified: Secondary | ICD-10-CM | POA: Diagnosis not present

## 2016-07-14 DIAGNOSIS — I110 Hypertensive heart disease with heart failure: Secondary | ICD-10-CM | POA: Diagnosis not present

## 2016-07-14 DIAGNOSIS — I509 Heart failure, unspecified: Secondary | ICD-10-CM | POA: Diagnosis not present

## 2016-07-14 DIAGNOSIS — F0151 Vascular dementia with behavioral disturbance: Secondary | ICD-10-CM | POA: Diagnosis not present

## 2016-07-14 DIAGNOSIS — J449 Chronic obstructive pulmonary disease, unspecified: Secondary | ICD-10-CM | POA: Diagnosis not present

## 2016-07-14 NOTE — Telephone Encounter (Signed)
Form was filled out but Group NPI was not on form. Added and faxed back to [employer].

## 2016-07-14 NOTE — Telephone Encounter (Signed)
Yes this was completed and placed to be faxed earlier this week

## 2016-07-14 NOTE — Telephone Encounter (Signed)
Caregiver, [name] was calling wondering if you filled out a form last week from [facility] for patient to get a Personal Care Aid. She stated a Education officer, museum from [facility] faxed it last week for you to fill out. They told her they have not received anything back. Please Advise.

## 2016-07-17 DIAGNOSIS — I110 Hypertensive heart disease with heart failure: Secondary | ICD-10-CM | POA: Diagnosis not present

## 2016-07-17 DIAGNOSIS — E785 Hyperlipidemia, unspecified: Secondary | ICD-10-CM | POA: Diagnosis not present

## 2016-07-17 DIAGNOSIS — J449 Chronic obstructive pulmonary disease, unspecified: Secondary | ICD-10-CM | POA: Diagnosis not present

## 2016-07-17 DIAGNOSIS — I509 Heart failure, unspecified: Secondary | ICD-10-CM | POA: Diagnosis not present

## 2016-07-17 DIAGNOSIS — F0151 Vascular dementia with behavioral disturbance: Secondary | ICD-10-CM | POA: Diagnosis not present

## 2016-07-17 DIAGNOSIS — M25571 Pain in right ankle and joints of right foot: Secondary | ICD-10-CM | POA: Diagnosis not present

## 2016-07-18 ENCOUNTER — Other Ambulatory Visit: Payer: Self-pay | Admitting: Nurse Practitioner

## 2016-07-18 DIAGNOSIS — I110 Hypertensive heart disease with heart failure: Secondary | ICD-10-CM | POA: Diagnosis not present

## 2016-07-18 DIAGNOSIS — J449 Chronic obstructive pulmonary disease, unspecified: Secondary | ICD-10-CM | POA: Diagnosis not present

## 2016-07-18 DIAGNOSIS — F0151 Vascular dementia with behavioral disturbance: Secondary | ICD-10-CM | POA: Diagnosis not present

## 2016-07-18 DIAGNOSIS — I509 Heart failure, unspecified: Secondary | ICD-10-CM | POA: Diagnosis not present

## 2016-07-18 DIAGNOSIS — M25571 Pain in right ankle and joints of right foot: Secondary | ICD-10-CM | POA: Diagnosis not present

## 2016-07-18 DIAGNOSIS — E785 Hyperlipidemia, unspecified: Secondary | ICD-10-CM | POA: Diagnosis not present

## 2016-07-19 DIAGNOSIS — I509 Heart failure, unspecified: Secondary | ICD-10-CM | POA: Diagnosis not present

## 2016-07-19 DIAGNOSIS — F0151 Vascular dementia with behavioral disturbance: Secondary | ICD-10-CM | POA: Diagnosis not present

## 2016-07-19 DIAGNOSIS — J449 Chronic obstructive pulmonary disease, unspecified: Secondary | ICD-10-CM | POA: Diagnosis not present

## 2016-07-19 DIAGNOSIS — M25571 Pain in right ankle and joints of right foot: Secondary | ICD-10-CM | POA: Diagnosis not present

## 2016-07-19 DIAGNOSIS — E785 Hyperlipidemia, unspecified: Secondary | ICD-10-CM | POA: Diagnosis not present

## 2016-07-19 DIAGNOSIS — I110 Hypertensive heart disease with heart failure: Secondary | ICD-10-CM | POA: Diagnosis not present

## 2016-07-20 DIAGNOSIS — J449 Chronic obstructive pulmonary disease, unspecified: Secondary | ICD-10-CM | POA: Diagnosis not present

## 2016-07-20 DIAGNOSIS — M25571 Pain in right ankle and joints of right foot: Secondary | ICD-10-CM | POA: Diagnosis not present

## 2016-07-20 DIAGNOSIS — I509 Heart failure, unspecified: Secondary | ICD-10-CM | POA: Diagnosis not present

## 2016-07-20 DIAGNOSIS — I110 Hypertensive heart disease with heart failure: Secondary | ICD-10-CM | POA: Diagnosis not present

## 2016-07-20 DIAGNOSIS — F0151 Vascular dementia with behavioral disturbance: Secondary | ICD-10-CM | POA: Diagnosis not present

## 2016-07-20 DIAGNOSIS — E785 Hyperlipidemia, unspecified: Secondary | ICD-10-CM | POA: Diagnosis not present

## 2016-07-21 DIAGNOSIS — I110 Hypertensive heart disease with heart failure: Secondary | ICD-10-CM | POA: Diagnosis not present

## 2016-07-21 DIAGNOSIS — M25571 Pain in right ankle and joints of right foot: Secondary | ICD-10-CM | POA: Diagnosis not present

## 2016-07-21 DIAGNOSIS — J449 Chronic obstructive pulmonary disease, unspecified: Secondary | ICD-10-CM | POA: Diagnosis not present

## 2016-07-21 DIAGNOSIS — F0151 Vascular dementia with behavioral disturbance: Secondary | ICD-10-CM | POA: Diagnosis not present

## 2016-07-21 DIAGNOSIS — I509 Heart failure, unspecified: Secondary | ICD-10-CM | POA: Diagnosis not present

## 2016-07-21 DIAGNOSIS — E785 Hyperlipidemia, unspecified: Secondary | ICD-10-CM | POA: Diagnosis not present

## 2016-07-21 NOTE — Telephone Encounter (Signed)
Refaxed PCS Form to [employer] per daughter.

## 2016-07-24 ENCOUNTER — Other Ambulatory Visit: Payer: Self-pay | Admitting: Internal Medicine

## 2016-07-24 DIAGNOSIS — I10 Essential (primary) hypertension: Secondary | ICD-10-CM

## 2016-07-24 DIAGNOSIS — I509 Heart failure, unspecified: Secondary | ICD-10-CM

## 2016-07-26 DIAGNOSIS — I110 Hypertensive heart disease with heart failure: Secondary | ICD-10-CM | POA: Diagnosis not present

## 2016-07-26 DIAGNOSIS — F0151 Vascular dementia with behavioral disturbance: Secondary | ICD-10-CM | POA: Diagnosis not present

## 2016-07-26 DIAGNOSIS — M25571 Pain in right ankle and joints of right foot: Secondary | ICD-10-CM | POA: Diagnosis not present

## 2016-07-26 DIAGNOSIS — I509 Heart failure, unspecified: Secondary | ICD-10-CM | POA: Diagnosis not present

## 2016-07-26 DIAGNOSIS — J449 Chronic obstructive pulmonary disease, unspecified: Secondary | ICD-10-CM | POA: Diagnosis not present

## 2016-07-26 DIAGNOSIS — E785 Hyperlipidemia, unspecified: Secondary | ICD-10-CM | POA: Diagnosis not present

## 2016-07-28 DIAGNOSIS — E785 Hyperlipidemia, unspecified: Secondary | ICD-10-CM | POA: Diagnosis not present

## 2016-07-28 DIAGNOSIS — F0151 Vascular dementia with behavioral disturbance: Secondary | ICD-10-CM | POA: Diagnosis not present

## 2016-07-28 DIAGNOSIS — J449 Chronic obstructive pulmonary disease, unspecified: Secondary | ICD-10-CM | POA: Diagnosis not present

## 2016-07-28 DIAGNOSIS — M25571 Pain in right ankle and joints of right foot: Secondary | ICD-10-CM | POA: Diagnosis not present

## 2016-07-28 DIAGNOSIS — I110 Hypertensive heart disease with heart failure: Secondary | ICD-10-CM | POA: Diagnosis not present

## 2016-07-28 DIAGNOSIS — I509 Heart failure, unspecified: Secondary | ICD-10-CM | POA: Diagnosis not present

## 2016-07-31 DIAGNOSIS — M25571 Pain in right ankle and joints of right foot: Secondary | ICD-10-CM | POA: Diagnosis not present

## 2016-07-31 DIAGNOSIS — F0151 Vascular dementia with behavioral disturbance: Secondary | ICD-10-CM | POA: Diagnosis not present

## 2016-07-31 DIAGNOSIS — I509 Heart failure, unspecified: Secondary | ICD-10-CM | POA: Diagnosis not present

## 2016-07-31 DIAGNOSIS — J449 Chronic obstructive pulmonary disease, unspecified: Secondary | ICD-10-CM | POA: Diagnosis not present

## 2016-07-31 DIAGNOSIS — E785 Hyperlipidemia, unspecified: Secondary | ICD-10-CM | POA: Diagnosis not present

## 2016-07-31 DIAGNOSIS — I110 Hypertensive heart disease with heart failure: Secondary | ICD-10-CM | POA: Diagnosis not present

## 2016-08-02 DIAGNOSIS — E785 Hyperlipidemia, unspecified: Secondary | ICD-10-CM | POA: Diagnosis not present

## 2016-08-02 DIAGNOSIS — I509 Heart failure, unspecified: Secondary | ICD-10-CM | POA: Diagnosis not present

## 2016-08-02 DIAGNOSIS — I110 Hypertensive heart disease with heart failure: Secondary | ICD-10-CM | POA: Diagnosis not present

## 2016-08-02 DIAGNOSIS — F0151 Vascular dementia with behavioral disturbance: Secondary | ICD-10-CM | POA: Diagnosis not present

## 2016-08-02 DIAGNOSIS — M25571 Pain in right ankle and joints of right foot: Secondary | ICD-10-CM | POA: Diagnosis not present

## 2016-08-02 DIAGNOSIS — J449 Chronic obstructive pulmonary disease, unspecified: Secondary | ICD-10-CM | POA: Diagnosis not present

## 2016-08-04 DIAGNOSIS — J449 Chronic obstructive pulmonary disease, unspecified: Secondary | ICD-10-CM | POA: Diagnosis not present

## 2016-08-04 DIAGNOSIS — E785 Hyperlipidemia, unspecified: Secondary | ICD-10-CM | POA: Diagnosis not present

## 2016-08-04 DIAGNOSIS — M25571 Pain in right ankle and joints of right foot: Secondary | ICD-10-CM | POA: Diagnosis not present

## 2016-08-04 DIAGNOSIS — I509 Heart failure, unspecified: Secondary | ICD-10-CM | POA: Diagnosis not present

## 2016-08-04 DIAGNOSIS — I110 Hypertensive heart disease with heart failure: Secondary | ICD-10-CM | POA: Diagnosis not present

## 2016-08-04 DIAGNOSIS — F0151 Vascular dementia with behavioral disturbance: Secondary | ICD-10-CM | POA: Diagnosis not present

## 2016-08-07 ENCOUNTER — Telehealth: Payer: Self-pay | Admitting: *Deleted

## 2016-08-07 DIAGNOSIS — M25571 Pain in right ankle and joints of right foot: Secondary | ICD-10-CM | POA: Diagnosis not present

## 2016-08-07 DIAGNOSIS — E785 Hyperlipidemia, unspecified: Secondary | ICD-10-CM | POA: Diagnosis not present

## 2016-08-07 DIAGNOSIS — F0151 Vascular dementia with behavioral disturbance: Secondary | ICD-10-CM | POA: Diagnosis not present

## 2016-08-07 DIAGNOSIS — J449 Chronic obstructive pulmonary disease, unspecified: Secondary | ICD-10-CM | POA: Diagnosis not present

## 2016-08-07 DIAGNOSIS — I509 Heart failure, unspecified: Secondary | ICD-10-CM | POA: Diagnosis not present

## 2016-08-07 DIAGNOSIS — I110 Hypertensive heart disease with heart failure: Secondary | ICD-10-CM | POA: Diagnosis not present

## 2016-08-07 NOTE — Telephone Encounter (Signed)
Received CMN for Oxygen from [facility] [phone] Fax: [phone] Completed and given to [name] to review and sign. To be faxed back.

## 2016-08-07 NOTE — Telephone Encounter (Signed)
N[place] with [facility] called requesting orders for PT 2x3ks. Verbal orders given and agreed.

## 2016-08-08 DIAGNOSIS — M25571 Pain in right ankle and joints of right foot: Secondary | ICD-10-CM | POA: Diagnosis not present

## 2016-08-08 DIAGNOSIS — I509 Heart failure, unspecified: Secondary | ICD-10-CM | POA: Diagnosis not present

## 2016-08-08 DIAGNOSIS — F0151 Vascular dementia with behavioral disturbance: Secondary | ICD-10-CM | POA: Diagnosis not present

## 2016-08-08 DIAGNOSIS — J449 Chronic obstructive pulmonary disease, unspecified: Secondary | ICD-10-CM | POA: Diagnosis not present

## 2016-08-08 DIAGNOSIS — I110 Hypertensive heart disease with heart failure: Secondary | ICD-10-CM | POA: Diagnosis not present

## 2016-08-08 DIAGNOSIS — E785 Hyperlipidemia, unspecified: Secondary | ICD-10-CM | POA: Diagnosis not present

## 2016-08-09 DIAGNOSIS — E785 Hyperlipidemia, unspecified: Secondary | ICD-10-CM | POA: Diagnosis not present

## 2016-08-09 DIAGNOSIS — I509 Heart failure, unspecified: Secondary | ICD-10-CM | POA: Diagnosis not present

## 2016-08-09 DIAGNOSIS — J449 Chronic obstructive pulmonary disease, unspecified: Secondary | ICD-10-CM | POA: Diagnosis not present

## 2016-08-09 DIAGNOSIS — M25571 Pain in right ankle and joints of right foot: Secondary | ICD-10-CM | POA: Diagnosis not present

## 2016-08-09 DIAGNOSIS — F0151 Vascular dementia with behavioral disturbance: Secondary | ICD-10-CM | POA: Diagnosis not present

## 2016-08-09 DIAGNOSIS — I110 Hypertensive heart disease with heart failure: Secondary | ICD-10-CM | POA: Diagnosis not present

## 2016-08-10 DIAGNOSIS — E785 Hyperlipidemia, unspecified: Secondary | ICD-10-CM | POA: Diagnosis not present

## 2016-08-10 DIAGNOSIS — J449 Chronic obstructive pulmonary disease, unspecified: Secondary | ICD-10-CM | POA: Diagnosis not present

## 2016-08-10 DIAGNOSIS — F0151 Vascular dementia with behavioral disturbance: Secondary | ICD-10-CM | POA: Diagnosis not present

## 2016-08-10 DIAGNOSIS — I110 Hypertensive heart disease with heart failure: Secondary | ICD-10-CM | POA: Diagnosis not present

## 2016-08-10 DIAGNOSIS — M25571 Pain in right ankle and joints of right foot: Secondary | ICD-10-CM | POA: Diagnosis not present

## 2016-08-10 DIAGNOSIS — I509 Heart failure, unspecified: Secondary | ICD-10-CM | POA: Diagnosis not present

## 2016-08-16 DIAGNOSIS — I110 Hypertensive heart disease with heart failure: Secondary | ICD-10-CM | POA: Diagnosis not present

## 2016-08-16 DIAGNOSIS — M25571 Pain in right ankle and joints of right foot: Secondary | ICD-10-CM | POA: Diagnosis not present

## 2016-08-16 DIAGNOSIS — F0151 Vascular dementia with behavioral disturbance: Secondary | ICD-10-CM | POA: Diagnosis not present

## 2016-08-16 DIAGNOSIS — E785 Hyperlipidemia, unspecified: Secondary | ICD-10-CM | POA: Diagnosis not present

## 2016-08-16 DIAGNOSIS — I509 Heart failure, unspecified: Secondary | ICD-10-CM | POA: Diagnosis not present

## 2016-08-16 DIAGNOSIS — J449 Chronic obstructive pulmonary disease, unspecified: Secondary | ICD-10-CM | POA: Diagnosis not present

## 2016-08-17 ENCOUNTER — Institutional Professional Consult (permissible substitution): Payer: Medicare Other | Admitting: Internal Medicine

## 2016-08-17 DIAGNOSIS — J449 Chronic obstructive pulmonary disease, unspecified: Secondary | ICD-10-CM | POA: Diagnosis not present

## 2016-08-17 DIAGNOSIS — E785 Hyperlipidemia, unspecified: Secondary | ICD-10-CM | POA: Diagnosis not present

## 2016-08-17 DIAGNOSIS — I110 Hypertensive heart disease with heart failure: Secondary | ICD-10-CM | POA: Diagnosis not present

## 2016-08-17 DIAGNOSIS — F0151 Vascular dementia with behavioral disturbance: Secondary | ICD-10-CM | POA: Diagnosis not present

## 2016-08-17 DIAGNOSIS — I509 Heart failure, unspecified: Secondary | ICD-10-CM | POA: Diagnosis not present

## 2016-08-17 DIAGNOSIS — M25571 Pain in right ankle and joints of right foot: Secondary | ICD-10-CM | POA: Diagnosis not present

## 2016-08-18 DIAGNOSIS — I110 Hypertensive heart disease with heart failure: Secondary | ICD-10-CM | POA: Diagnosis not present

## 2016-08-18 DIAGNOSIS — J449 Chronic obstructive pulmonary disease, unspecified: Secondary | ICD-10-CM | POA: Diagnosis not present

## 2016-08-18 DIAGNOSIS — E785 Hyperlipidemia, unspecified: Secondary | ICD-10-CM | POA: Diagnosis not present

## 2016-08-18 DIAGNOSIS — F0151 Vascular dementia with behavioral disturbance: Secondary | ICD-10-CM | POA: Diagnosis not present

## 2016-08-18 DIAGNOSIS — M25571 Pain in right ankle and joints of right foot: Secondary | ICD-10-CM | POA: Diagnosis not present

## 2016-08-18 DIAGNOSIS — K8681 Exocrine pancreatic insufficiency: Secondary | ICD-10-CM | POA: Diagnosis not present

## 2016-08-18 DIAGNOSIS — Z8673 Personal history of transient ischemic attack (TIA), and cerebral infarction without residual deficits: Secondary | ICD-10-CM | POA: Diagnosis not present

## 2016-08-18 DIAGNOSIS — Z9181 History of falling: Secondary | ICD-10-CM | POA: Diagnosis not present

## 2016-08-18 DIAGNOSIS — I509 Heart failure, unspecified: Secondary | ICD-10-CM | POA: Diagnosis not present

## 2016-08-21 ENCOUNTER — Telehealth: Payer: Self-pay

## 2016-08-21 NOTE — Telephone Encounter (Signed)
I called patient's daughter to see about rescheduling patient's CPE 09/07/16 per Jessica's request. Going to try to reschedule for any day after 09/07/16.   Left message for [name] to call the office.

## 2016-08-23 NOTE — Telephone Encounter (Signed)
Called patient but there was no answer. Unable to leave a message due to mail box being full.

## 2016-08-24 DIAGNOSIS — J449 Chronic obstructive pulmonary disease, unspecified: Secondary | ICD-10-CM | POA: Diagnosis not present

## 2016-08-24 DIAGNOSIS — E785 Hyperlipidemia, unspecified: Secondary | ICD-10-CM | POA: Diagnosis not present

## 2016-08-24 DIAGNOSIS — F0151 Vascular dementia with behavioral disturbance: Secondary | ICD-10-CM | POA: Diagnosis not present

## 2016-08-24 DIAGNOSIS — M25571 Pain in right ankle and joints of right foot: Secondary | ICD-10-CM | POA: Diagnosis not present

## 2016-08-24 DIAGNOSIS — I509 Heart failure, unspecified: Secondary | ICD-10-CM | POA: Diagnosis not present

## 2016-08-24 DIAGNOSIS — I110 Hypertensive heart disease with heart failure: Secondary | ICD-10-CM | POA: Diagnosis not present

## 2016-08-27 ENCOUNTER — Other Ambulatory Visit: Payer: Self-pay | Admitting: Nurse Practitioner

## 2016-08-27 DIAGNOSIS — F01518 Vascular dementia, unspecified severity, with other behavioral disturbance: Secondary | ICD-10-CM

## 2016-08-27 DIAGNOSIS — F0151 Vascular dementia with behavioral disturbance: Secondary | ICD-10-CM

## 2016-08-29 ENCOUNTER — Encounter: Payer: Self-pay | Admitting: Nurse Practitioner

## 2016-09-04 ENCOUNTER — Ambulatory Visit: Payer: Medicare Other

## 2016-09-04 ENCOUNTER — Ambulatory Visit: Payer: Medicare Other | Admitting: Nurse Practitioner

## 2016-09-05 DIAGNOSIS — I509 Heart failure, unspecified: Secondary | ICD-10-CM | POA: Diagnosis not present

## 2016-09-05 DIAGNOSIS — E785 Hyperlipidemia, unspecified: Secondary | ICD-10-CM | POA: Diagnosis not present

## 2016-09-05 DIAGNOSIS — I110 Hypertensive heart disease with heart failure: Secondary | ICD-10-CM | POA: Diagnosis not present

## 2016-09-05 DIAGNOSIS — J449 Chronic obstructive pulmonary disease, unspecified: Secondary | ICD-10-CM | POA: Diagnosis not present

## 2016-09-05 DIAGNOSIS — M25571 Pain in right ankle and joints of right foot: Secondary | ICD-10-CM | POA: Diagnosis not present

## 2016-09-05 DIAGNOSIS — F0151 Vascular dementia with behavioral disturbance: Secondary | ICD-10-CM | POA: Diagnosis not present

## 2016-09-07 ENCOUNTER — Ambulatory Visit: Payer: Medicare Other | Admitting: Nurse Practitioner

## 2016-09-07 ENCOUNTER — Ambulatory Visit (INDEPENDENT_AMBULATORY_CARE_PROVIDER_SITE_OTHER): Payer: Medicare Other | Admitting: Nurse Practitioner

## 2016-09-07 ENCOUNTER — Encounter: Payer: Self-pay | Admitting: Nurse Practitioner

## 2016-09-07 ENCOUNTER — Ambulatory Visit (INDEPENDENT_AMBULATORY_CARE_PROVIDER_SITE_OTHER): Payer: Medicare Other

## 2016-09-07 VITALS — BP 122/72 | HR 63 | Temp 98.1°F | Resp 17 | Ht 62.0 in | Wt 151.4 lb

## 2016-09-07 VITALS — BP 122/72 | HR 63 | Temp 98.1°F | Ht 62.0 in | Wt 151.4 lb

## 2016-09-07 DIAGNOSIS — Z0000 Encounter for general adult medical examination without abnormal findings: Secondary | ICD-10-CM | POA: Diagnosis not present

## 2016-09-07 DIAGNOSIS — I509 Heart failure, unspecified: Secondary | ICD-10-CM | POA: Diagnosis not present

## 2016-09-07 DIAGNOSIS — Z23 Encounter for immunization: Secondary | ICD-10-CM | POA: Diagnosis not present

## 2016-09-07 DIAGNOSIS — R634 Abnormal weight loss: Secondary | ICD-10-CM | POA: Diagnosis not present

## 2016-09-07 DIAGNOSIS — M25571 Pain in right ankle and joints of right foot: Secondary | ICD-10-CM | POA: Diagnosis not present

## 2016-09-07 DIAGNOSIS — I10 Essential (primary) hypertension: Secondary | ICD-10-CM

## 2016-09-07 DIAGNOSIS — F01518 Vascular dementia, unspecified severity, with other behavioral disturbance: Secondary | ICD-10-CM

## 2016-09-07 DIAGNOSIS — E2839 Other primary ovarian failure: Secondary | ICD-10-CM | POA: Diagnosis not present

## 2016-09-07 DIAGNOSIS — I110 Hypertensive heart disease with heart failure: Secondary | ICD-10-CM | POA: Diagnosis not present

## 2016-09-07 DIAGNOSIS — I639 Cerebral infarction, unspecified: Secondary | ICD-10-CM

## 2016-09-07 DIAGNOSIS — E782 Mixed hyperlipidemia: Secondary | ICD-10-CM

## 2016-09-07 DIAGNOSIS — N183 Chronic kidney disease, stage 3 unspecified: Secondary | ICD-10-CM

## 2016-09-07 DIAGNOSIS — E785 Hyperlipidemia, unspecified: Secondary | ICD-10-CM | POA: Diagnosis not present

## 2016-09-07 DIAGNOSIS — K8681 Exocrine pancreatic insufficiency: Secondary | ICD-10-CM | POA: Diagnosis not present

## 2016-09-07 DIAGNOSIS — F0151 Vascular dementia with behavioral disturbance: Secondary | ICD-10-CM | POA: Diagnosis not present

## 2016-09-07 DIAGNOSIS — J449 Chronic obstructive pulmonary disease, unspecified: Secondary | ICD-10-CM | POA: Diagnosis not present

## 2016-09-07 MED ORDER — ZOSTER VAC RECOMB ADJUVANTED 50 MCG/0.5ML IM SUSR: 0.5000 mL | Freq: Once | INTRAMUSCULAR | 1 refills | Status: AC

## 2016-09-07 MED ORDER — ATORVASTATIN CALCIUM 10 MG PO TABS: 10.0000 mg | ORAL_TABLET | Freq: Every evening | ORAL | 1 refills | Status: DC

## 2016-09-07 MED ORDER — TETANUS-DIPHTH-ACELL PERTUSSIS 5-2.5-18.5 LF-MCG/0.5 IM SUSP: 0.5000 mL | Freq: Once | INTRAMUSCULAR | 0 refills | Status: AC

## 2016-09-07 NOTE — Patient Instructions (Addendum)
Make sure to schedule appt with cardiologist   Health Maintenance, Female Adopting a healthy lifestyle and getting preventive care can go a long way to promote health and wellness. Talk with your health care provider about what schedule of regular examinations is right for you. This is a good chance for you to check in with your provider about disease prevention and staying healthy. In between checkups, there are plenty of things you can do on your own. Experts have done a lot of research about which lifestyle changes and preventive measures are most likely to keep you healthy. Ask your health care provider for more information. Weight and diet Eat a healthy diet  Be sure to include plenty of vegetables, fruits, low-fat dairy products, and lean protein.  Do not eat a lot of foods high in solid fats, added sugars, or salt.  Get regular exercise. This is one of the most important things you can do for your health. ? Most adults should exercise for at least 150 minutes each week. The exercise should increase your heart rate and make you sweat (moderate-intensity exercise). ? Most adults should also do strengthening exercises at least twice a week. This is in addition to the moderate-intensity exercise.  Maintain a healthy weight  Body mass index (BMI) is a measurement that can be used to identify possible weight problems. It estimates body fat based on height and weight. Your health care provider can help determine your BMI and help you achieve or maintain a healthy weight.  For females 32 years of age and older: ? A BMI below 18.5 is considered underweight. ? A BMI of 18.5 to 24.9 is normal. ? A BMI of 25 to 29.9 is considered overweight. ? A BMI of 30 and above is considered obese.  Watch levels of cholesterol and blood lipids  You should start having your blood tested for lipids and cholesterol at 75 years of age, then have this test every 5 years.  You may need to have your cholesterol  levels checked more often if: ? Your lipid or cholesterol levels are high. ? You are older than 75 years of age. ? You are at high risk for heart disease.  Cancer screening Lung Cancer  Lung cancer screening is recommended for adults 66-80 years old who are at high risk for lung cancer because of a history of smoking.  A yearly low-dose CT scan of the lungs is recommended for people who: ? Currently smoke. ? Have quit within the past 15 years. ? Have at least a 30-pack-year history of smoking. A pack year is smoking an average of one pack of cigarettes a day for 1 year.  Yearly screening should continue until it has been 15 years since you quit.  Yearly screening should stop if you develop a health problem that would prevent you from having lung cancer treatment.  Breast Cancer  Practice breast self-awareness. This means understanding how your breasts normally appear and feel.  It also means doing regular breast self-exams. Let your health care provider know about any changes, no matter how small.  If you are in your 20s or 30s, you should have a clinical breast exam (CBE) by a health care provider every 1-3 years as part of a regular health exam.  If you are 60 or older, have a CBE every year. Also consider having a breast X-ray (mammogram) every year.  If you have a family history of breast cancer, talk to your health care provider about  genetic screening.  If you are at high risk for breast cancer, talk to your health care provider about having an MRI and a mammogram every year.  Breast cancer gene (BRCA) assessment is recommended for women who have family members with BRCA-related cancers. BRCA-related cancers include: ? Breast. ? Ovarian. ? Tubal. ? Peritoneal cancers.  Results of the assessment will determine the need for genetic counseling and BRCA1 and BRCA2 testing.  Cervical Cancer Your health care provider may recommend that you be screened regularly for cancer of  the pelvic organs (ovaries, uterus, and vagina). This screening involves a pelvic examination, including checking for microscopic changes to the surface of your cervix (Pap test). You may be encouraged to have this screening done every 3 years, beginning at age 98.  For women ages 40-65, health care providers may recommend pelvic exams and Pap testing every 3 years, or they may recommend the Pap and pelvic exam, combined with testing for human papilloma virus (HPV), every 5 years. Some types of HPV increase your risk of cervical cancer. Testing for HPV may also be done on women of any age with unclear Pap test results.  Other health care providers may not recommend any screening for nonpregnant women who are considered low risk for pelvic cancer and who do not have symptoms. Ask your health care provider if a screening pelvic exam is right for you.  If you have had past treatment for cervical cancer or a condition that could lead to cancer, you need Pap tests and screening for cancer for at least 20 years after your treatment. If Pap tests have been discontinued, your risk factors (such as having a new sexual partner) need to be reassessed to determine if screening should resume. Some women have medical problems that increase the chance of getting cervical cancer. In these cases, your health care provider may recommend more frequent screening and Pap tests.  Colorectal Cancer  This type of cancer can be detected and often prevented.  Routine colorectal cancer screening usually begins at 75 years of age and continues through 75 years of age.  Your health care provider may recommend screening at an earlier age if you have risk factors for colon cancer.  Your health care provider may also recommend using home test kits to check for hidden blood in the stool.  A small camera at the end of a tube can be used to examine your colon directly (sigmoidoscopy or colonoscopy). This is done to check for the  earliest forms of colorectal cancer.  Routine screening usually begins at age 28.  Direct examination of the colon should be repeated every 5-10 years through 75 years of age. However, you may need to be screened more often if early forms of precancerous polyps or small growths are found.  Skin Cancer  Check your skin from head to toe regularly.  Tell your health care provider about any new moles or changes in moles, especially if there is a change in a mole's shape or color.  Also tell your health care provider if you have a mole that is larger than the size of a pencil eraser.  Always use sunscreen. Apply sunscreen liberally and repeatedly throughout the day.  Protect yourself by wearing long sleeves, pants, a wide-brimmed hat, and sunglasses whenever you are outside.  Heart disease, diabetes, and high blood pressure  High blood pressure causes heart disease and increases the risk of stroke. High blood pressure is more likely to develop in: ? People  who have blood pressure in the high end of the normal range (130-139/85-89 mm Hg). ? People who are overweight or obese. ? People who are African American.  If you are 5-22 years of age, have your blood pressure checked every 3-5 years. If you are 69 years of age or older, have your blood pressure checked every year. You should have your blood pressure measured twice-once when you are at a hospital or clinic, and once when you are not at a hospital or clinic. Record the average of the two measurements. To check your blood pressure when you are not at a hospital or clinic, you can use: ? An automated blood pressure machine at a pharmacy. ? A home blood pressure monitor.  If you are between 29 years and 27 years old, ask your health care provider if you should take aspirin to prevent strokes.  Have regular diabetes screenings. This involves taking a blood sample to check your fasting blood sugar level. ? If you are at a normal weight and  have a low risk for diabetes, have this test once every three years after 75 years of age. ? If you are overweight and have a high risk for diabetes, consider being tested at a younger age or more often. Preventing infection Hepatitis B  If you have a higher risk for hepatitis B, you should be screened for this virus. You are considered at high risk for hepatitis B if: ? You were born in a country where hepatitis B is common. Ask your health care provider which countries are considered high risk. ? Your parents were born in a high-risk country, and you have not been immunized against hepatitis B (hepatitis B vaccine). ? You have HIV or AIDS. ? You use needles to inject street drugs. ? You live with someone who has hepatitis B. ? You have had sex with someone who has hepatitis B. ? You get hemodialysis treatment. ? You take certain medicines for conditions, including cancer, organ transplantation, and autoimmune conditions.  Hepatitis C  Blood testing is recommended for: ? Everyone born from [birth year] through 1964. ? Anyone with known risk factors for hepatitis C.  Sexually transmitted infections (STIs)  You should be screened for sexually transmitted infections (STIs) including gonorrhea and chlamydia if: ? You are sexually active and are younger than 75 years of age. ? You are older than 75 years of age and your health care provider tells you that you are at risk for this type of infection. ? Your sexual activity has changed since you were last screened and you are at an increased risk for chlamydia or gonorrhea. Ask your health care provider if you are at risk.  If you do not have HIV, but are at risk, it may be recommended that you take a prescription medicine daily to prevent HIV infection. This is called pre-exposure prophylaxis (PrEP). You are considered at risk if: ? You are sexually active and do not regularly use condoms or know the HIV status of your partner(s). ? You take drugs by  injection. ? You are sexually active with a partner who has HIV.  Talk with your health care provider about whether you are at high risk of being infected with HIV. If you choose to begin PrEP, you should first be tested for HIV. You should then be tested every 3 months for as long as you are taking PrEP. Pregnancy  If you are premenopausal and you may become pregnant, ask your health care  provider about preconception counseling.  If you may become pregnant, take 400 to 800 micrograms (mcg) of folic acid every day.  If you want to prevent pregnancy, talk to your health care provider about birth control (contraception). Osteoporosis and menopause  Osteoporosis is a disease in which the bones lose minerals and strength with aging. This can result in serious bone fractures. Your risk for osteoporosis can be identified using a bone density scan.  If you are 35 years of age or older, or if you are at risk for osteoporosis and fractures, ask your health care provider if you should be screened.  Ask your health care provider whether you should take a calcium or vitamin D supplement to lower your risk for osteoporosis.  Menopause may have certain physical symptoms and risks.  Hormone replacement therapy may reduce some of these symptoms and risks. Talk to your health care provider about whether hormone replacement therapy is right for you. Follow these instructions at home:  Schedule regular health, dental, and eye exams.  Stay current with your immunizations.  Do not use any tobacco products including cigarettes, chewing tobacco, or electronic cigarettes.  If you are pregnant, do not drink alcohol.  If you are breastfeeding, limit how much and how often you drink alcohol.  Limit alcohol intake to no more than 1 drink per day for nonpregnant women. One drink equals 12 ounces of beer, 5 ounces of wine, or 1 ounces of hard liquor.  Do not use street drugs.  Do not share needles.  Ask  your health care provider for help if you need support or information about quitting drugs.  Tell your health care provider if you often feel depressed.  Tell your health care provider if you have ever been abused or do not feel safe at home. This information is not intended to replace advice given to you by your health care provider. Make sure you discuss any questions you have with your health care provider. Document Released: 07/25/2010 Document Revised: 06/17/2015 Document Reviewed: 10/13/2014 Elsevier Interactive Patient Education  [employer].

## 2016-09-07 NOTE — Progress Notes (Signed)
Subjective:   Carol Monroe is a 76 y.o. female who presents for Medicare Annual (Subsequent) preventive examination.  Last AWV-08/26/2015    Objective:     Vitals: BP 122/72 (BP Location: Left Arm, Patient Position: Sitting)   Pulse 63   Temp 98.1 F (36.7 C) (Oral)   Ht 5\' 2"  (1.575 m)   Wt 151 lb 6.4 oz (68.7 kg)   SpO2 98%   BMI 27.69 kg/m   Body mass index is 27.69 kg/m.   Tobacco History  Smoking Status  . Former Smoker  . Packs/day: 2.00  . Years: 40.00  . Types: Cigarettes  . Quit date: 01/24/2006  Smokeless Tobacco  . Never Used     Counseling given: Not Answered   Past Medical History:  Diagnosis Date  . CHF (congestive heart failure) ([place])   . COPD (chronic obstructive pulmonary disease) ([place])   . Dementia   . Heart attack ([place])   . History of falling   . Hypertension   . Murmur, heart   . Stroke [facility])    Past Surgical History:  Procedure Laterality Date  . ABDOMINAL HYSTERECTOMY     Partial    Family History  Problem Relation Age of Onset  . Heart disease Mother   . Diabetes Mother   . Diabetes Father   . Breast cancer Daughter    History  Sexual Activity  . Sexual activity: No    Outpatient Encounter Prescriptions as of 09/07/2016  Medication Sig  . acetaminophen (TYLENOL) 500 MG tablet Take 1,000 mg by mouth 3 (three) times daily.  Marland Kitchen albuterol (PROVENTIL HFA;VENTOLIN HFA) 108 (90 Base) MCG/ACT inhaler Inhale 2 puffs into the lungs every 6 (six) hours as needed for wheezing or shortness of breath.  Marland Kitchen atorvastatin (LIPITOR) 10 MG tablet Take 1 tablet (10 mg total) by mouth every evening.  . carvedilol (COREG) 25 MG tablet TAKE 1 TABLET (25 MG TOTAL) BY MOUTH 2 (TWO) TIMES DAILY WITH A MEAL.  . chlorthalidone (HYGROTON) 25 MG tablet TAKE 1 TABLET (25 MG TOTAL) BY MOUTH EVERY MORNING.  Marland Kitchen CREON 24000-76000 units CPEP TAKE ONE CAPSULE BY MOUTH 3 TIMES A DAY WITH MEALS  . diclofenac sodium (VOLTAREN) 1 % GEL Apply 2 g topically daily.  Marland Kitchen  KLOR-CON M20 20 MEQ tablet TAKE 1 TABLET EVERY DAY ON MON,WED,FRI AND SAT  . lisinopril (PRINIVIL,ZESTRIL) 40 MG tablet TAKE 1 TABLET (40 MG TOTAL) BY MOUTH DAILY.  Marland Kitchen NAMZARIC 28-10 MG CP24 USE AS DIRECTED 1 CAPSULE IN THE MOUTH OR THROAT DAILY.  Marland Kitchen SPIRIVA HANDIHALER 18 MCG inhalation capsule PLACE 1 CAPSULE INTO INHALER AND INHALE DAILY  . Zoster Vac Recomb Adjuvanted [facility]) injection Inject 0.5 mLs into the muscle once.  . [DISCONTINUED] atorvastatin (LIPITOR) 10 MG tablet Take 10 mg by mouth every evening.  . [DISCONTINUED] predniSONE (STERAPRED UNI-PAK 21 TAB) 10 MG (21) TBPK tablet Take as directed  . [DISCONTINUED] Zoster Vac Recomb Adjuvanted [facility]) injection Inject 0.5 mLs into the muscle once.   No facility-administered encounter medications on file as of 09/07/2016.     Activities of Daily Living In your present state of health, do you have any difficulty performing the following activities: 09/07/2016  Hearing? N  Vision? N  Difficulty concentrating or making decisions? Y  Walking or climbing stairs? N  Dressing or bathing? N  Doing errands, shopping? Y  Preparing Food and eating ? N  Using the Toilet? N  In the past six months, have you  accidently leaked urine? Y  Do you have problems with loss of bowel control? Y  Managing your Medications? Y  Managing your Finances? Y  Housekeeping or managing your Housekeeping? Y  Some recent data might be hidden    Patient Care Team: Lauree Chandler, NP as PCP - General (Nurse Practitioner) Adrian Prows, MD as Consulting Physician (Cardiology)    Assessment:     Exercise Activities and Dietary recommendations Current Exercise Habits: The patient does not participate in regular exercise at present, Exercise limited by: None identified  Goals    . Increase water intake          Patient will drink out of gallon jugs to measure water intake      Fall Risk Fall Risk  09/07/2016 06/29/2016 02/21/2016 08/26/2015 03/23/2015    Falls in the past year? No No No No No   Depression Screen PHQ 2/9 Scores 02/21/2016 08/26/2015 03/23/2015 08/20/2014  PHQ - 2 Score 0 0 0 0     Cognitive Function MMSE - Mini Mental State Exam 09/07/2016 08/26/2015 08/20/2014 09/11/2013 05/22/2012  Orientation to time 2 2 2 2  0  Orientation to Place 0 0 0 0 3  Registration 3 3 3 3 3   Attention/ Calculation 4 3 4 5 2   Recall 0 1 0 0 1  Language- name 2 objects 2 2 2 2 2   Language- repeat 1 1 1 1 1   Language- follow 3 step command 2 3 3 3 3   Language- read & follow direction 1 1 1 1 1   Write a sentence 1 0 1 1 0  Copy design 0 0 0 0 0  Total score 16 16 17 18 16         Immunization History  Administered Date(s) Administered  . Influenza,inj,Quad PF,36+ Mos 10/16/2012, 12/03/2013, 02/21/2016  . PPD Test 06/14/2016  . Pneumococcal Conjugate-13 04/10/2013, 08/20/2014   Screening Tests Health Maintenance  Topic Date Due  . INFLUENZA VACCINE  08/23/2016  . MAMMOGRAM  06/16/2017 (Originally 10/02/2015)  . DEXA SCAN  06/16/2017 (Originally 01/18/2007)  . TETANUS/TDAP  06/16/2017 (Originally 01/17/1960)  . PNA vac Low Risk Adult (2 of 2 - PPSV23) 06/16/2017 (Originally 08/20/2015)  . COLONOSCOPY  01/29/2017      Plan:    I have personally reviewed and addressed the Medicare Annual Wellness questionnaire and have noted the following in the patient's chart:  A. Medical and social history B. Use of alcohol, tobacco or illicit drugs  C. Current medications and supplements D. Functional ability and status E.  Nutritional status F.  Physical activity G. Advance directives H. List of other physicians I.  Hospitalizations, surgeries, and ER visits in previous 12 months J.  [place] to include hearing, vision, cognitive, depression L. Referrals and appointments - none  In addition, I have reviewed and discussed with patient certain preventive protocols, quality metrics, and best practice recommendations. A written personalized  care plan for preventive services as well as general preventive health recommendations were provided to patient.  See attached scanned questionnaire for additional information.   Signed,   Rich Reining, RN Nurse Health Advisor   Quick Notes   Health Maintenance: PNA 23 given. DEXA, tdap and due     Abnormal Screen: MMSE 16/30. Did not pass clock drawing     Patient Concerns: none     Nurse Concerns: none

## 2016-09-07 NOTE — Patient Instructions (Addendum)
Carol Monroe , Thank you for taking time to come for your Medicare Wellness Visit. I appreciate your ongoing commitment to your health goals. Please review the following plan we discussed and let me know if I can assist you in the future.   Screening recommendations/referrals: Colonoscopy up to date.  Mammogram due Bone Density due Recommended yearly ophthalmology/optometry visit for glaucoma screening and checkup Recommended yearly dental visit for hygiene and checkup  Vaccinations: Influenza vaccine due 2018 fall season Pneumococcal vaccine 23 given. Up to date Tdap vaccine due Shingles vaccine   Advanced directives: Advance directive discussed with you today. I have provided a copy for you to complete at home and have notarized. Once this is complete please bring a copy in to our office so we can scan it into your chart.   Conditions/risks identified: None  Next appointment: None upcoming   Preventive Care 65 Years and Older, Female Preventive care refers to lifestyle choices and visits with your health care provider that can promote health and wellness. What does preventive care include?  A yearly physical exam. This is also called an annual well check.  Dental exams once or twice a year.  Routine eye exams. Ask your health care provider how often you should have your eyes checked.  Personal lifestyle choices, including:  Daily care of your teeth and gums.  Regular physical activity.  Eating a healthy diet.  Avoiding tobacco and drug use.  Limiting alcohol use.  Practicing safe sex.  Taking low-dose aspirin every day.  Taking vitamin and mineral supplements as recommended by your health care provider. What happens during an annual well check? The services and screenings done by your health care provider during your annual well check will depend on your age, overall health, lifestyle risk factors, and family history of disease. Counseling  Your health care  provider may ask you questions about your:  Alcohol use.  Tobacco use.  Drug use.  Emotional well-being.  Home and relationship well-being.  Sexual activity.  Eating habits.  History of falls.  Memory and ability to understand (cognition).  Work and work Statistician.  Reproductive health. Screening  You may have the following tests or measurements:  Height, weight, and BMI.  Blood pressure.  Lipid and cholesterol levels. These may be checked every 5 years, or more frequently if you are over 17 years old.  Skin check.  Lung cancer screening. You may have this screening every year starting at age 58 if you have a 30-pack-year history of smoking and currently smoke or have quit within the past 15 years.  Fecal occult blood test (FOBT) of the stool. You may have this test every year starting at age 77.  Flexible sigmoidoscopy or colonoscopy. You may have a sigmoidoscopy every 5 years or a colonoscopy every 10 years starting at age 23.  Hepatitis C blood test.  Hepatitis B blood test.  Sexually transmitted disease (STD) testing.  Diabetes screening. This is done by checking your blood sugar (glucose) after you have not eaten for a while (fasting). You may have this done every 1-3 years.  Bone density scan. This is done to screen for osteoporosis. You may have this done starting at age 68.  Mammogram. This may be done every 1-2 years. Talk to your health care provider about how often you should have regular mammograms. Talk with your health care provider about your test results, treatment options, and if necessary, the need for more tests. Vaccines  Your health care provider  may recommend certain vaccines, such as:  Influenza vaccine. This is recommended every year.  Tetanus, diphtheria, and acellular pertussis (Tdap, Td) vaccine. You may need a Td booster every 10 years.  Zoster vaccine. You may need this after age 65.  Pneumococcal 13-valent conjugate (PCV13)  vaccine. One dose is recommended after age 64.  Pneumococcal polysaccharide (PPSV23) vaccine. One dose is recommended after age 21. Talk to your health care provider about which screenings and vaccines you need and how often you need them. This information is not intended to replace advice given to you by your health care provider. Make sure you discuss any questions you have with your health care provider. Document Released: 02/05/2015 Document Revised: 09/29/2015 Document Reviewed: 11/10/2014 Elsevier Interactive Patient Education  2017 [place] Prevention in the Home Falls can cause injuries. They can happen to people of all ages. There are many things you can do to make your home safe and to help prevent falls. What can I do on the outside of my home?  Regularly fix the edges of walkways and driveways and fix any cracks.  Remove anything that might make you trip as you walk through a door, such as a raised step or threshold.  Trim any bushes or trees on the path to your home.  Use bright outdoor lighting.  Clear any walking paths of anything that might make someone trip, such as rocks or tools.  Regularly check to see if handrails are loose or broken. Make sure that both sides of any steps have handrails.  Any raised decks and porches should have guardrails on the edges.  Have any leaves, snow, or ice cleared regularly.  Use sand or salt on walking paths during winter.  Clean up any spills in your garage right away. This includes oil or grease spills. What can I do in the bathroom?  Use night lights.  Install grab bars by the toilet and in the tub and shower. Do not use towel bars as grab bars.  Use non-skid mats or decals in the tub or shower.  If you need to sit down in the shower, use a plastic, non-slip stool.  Keep the floor dry. Clean up any water that spills on the floor as soon as it happens.  Remove soap buildup in the tub or shower  regularly.  Attach bath mats securely with double-sided non-slip rug tape.  Do not have throw rugs and other things on the floor that can make you trip. What can I do in the bedroom?  Use night lights.  Make sure that you have a light by your bed that is easy to reach.  Do not use any sheets or blankets that are too big for your bed. They should not hang down onto the floor.  Have a firm chair that has side arms. You can use this for support while you get dressed.  Do not have throw rugs and other things on the floor that can make you trip. What can I do in the kitchen?  Clean up any spills right away.  Avoid walking on wet floors.  Keep items that you use a lot in easy-to-reach places.  If you need to reach something above you, use a strong step stool that has a grab bar.  Keep electrical cords out of the way.  Do not use floor polish or wax that makes floors slippery. If you must use wax, use non-skid floor wax.  Do not have throw rugs  and other things on the floor that can make you trip. What can I do with my stairs?  Do not leave any items on the stairs.  Make sure that there are handrails on both sides of the stairs and use them. Fix handrails that are broken or loose. Make sure that handrails are as long as the stairways.  Check any carpeting to make sure that it is firmly attached to the stairs. Fix any carpet that is loose or worn.  Avoid having throw rugs at the top or bottom of the stairs. If you do have throw rugs, attach them to the floor with carpet tape.  Make sure that you have a light switch at the top of the stairs and the bottom of the stairs. If you do not have them, ask someone to add them for you. What else can I do to help prevent falls?  Wear shoes that:  Do not have high heels.  Have rubber bottoms.  Are comfortable and fit you well.  Are closed at the toe. Do not wear sandals.  If you use a stepladder:  Make sure that it is fully  opened. Do not climb a closed stepladder.  Make sure that both sides of the stepladder are locked into place.  Ask someone to hold it for you, if possible.  Clearly mark and make sure that you can see:  Any grab bars or handrails.  First and last steps.  Where the edge of each step is.  Use tools that help you move around (mobility aids) if they are needed. These include:  Canes.  Walkers.  Scooters.  Crutches.  Turn on the lights when you go into a dark area. Replace any light bulbs as soon as they burn out.  Set up your furniture so you have a clear path. Avoid moving your furniture around.  If any of your floors are uneven, fix them.  If there are any pets around you, be aware of where they are.  Review your medicines with your doctor. Some medicines can make you feel dizzy. This can increase your chance of falling. Ask your doctor what other things that you can do to help prevent falls. This information is not intended to replace advice given to you by your health care provider. Make sure you discuss any questions you have with your health care provider. Document Released: 11/05/2008 Document Revised: 06/17/2015 Document Reviewed: 02/13/2014 Elsevier Interactive Patient Education  2017 [employer].

## 2016-09-07 NOTE — Progress Notes (Signed)
Provider: Lauree Chandler, NP  Patient Care Team: Lauree Chandler, NP as PCP - General (Nurse Practitioner) Adrian Prows, MD as Consulting Physician (Cardiology)  Extended Emergency Contact Information Primary Emergency Contact: Reeder,Donna Address: [address]          [place], [place] [address] [place] Mobile Phone: [phone] Relation: Daughter No Known Allergies Code Status: DNR Goals of Care: Advanced Directive information Advanced Directives 09/07/2016  Does Patient Have a Medical Advance Directive? No  Does patient want to make changes to medical advance directive? -  Would patient like information on creating a medical advance directive? -     Chief Complaint  Patient presents with  . Medical Management of Chronic Issues    Pt is being seen for an extended visit. Pt has no concerns today.     HPI: Patient is a 76 y.o. female seen in today for an annual wellness exam.   Major illnesses or hospitalization in the last year.  Right ankle has improved- OA flare.   Depression screen [facility] 2/9 02/21/2016 08/26/2015 03/23/2015 08/20/2014 08/20/2014  Decreased Interest 0 0 0 0 0  Down, Depressed, Hopeless 0 0 0 0 0  PHQ - 2 Score 0 0 0 0 0    Fall Risk  09/07/2016 06/29/2016 02/21/2016 08/26/2015 03/23/2015  Falls in the past year? No No No No No   MMSE - Mini Mental State Exam 08/26/2015 08/20/2014 09/11/2013 05/22/2012  Orientation to time 2 2 2  0  Orientation to Place 0 0 0 3  Registration 3 3 3 3   Attention/ Calculation 3 4 5 2   Recall 1 0 0 1  Language- name 2 objects 2 2 2 2   Language- repeat 1 1 1 1   Language- follow 3 step command 3 3 3 3   Language- read & follow direction 1 1 1 1   Write a sentence 0 1 1 0  Copy design 0 0 0 0  Total score 16 17 18 16      Health Maintenance  Topic Date Due  . INFLUENZA VACCINE  08/23/2016  . MAMMOGRAM  06/16/2017 (Originally 10/02/2015)  . DEXA SCAN  06/16/2017 (Originally 01/18/2007)  . TETANUS/TDAP  06/16/2017  (Originally 01/17/1960)  . PNA vac Low Risk Adult (2 of 2 - PPSV23) 06/16/2017 (Originally 08/20/2015)  . COLONOSCOPY  01/29/2017    Urinary incontinence? Yes, wears pads  Diet?reg heart healthy, low sodium  Dentition: does not go.   Pain:none Previously in adult program but not fitting into the one she was in, daughter reports she was regressing at that program. Martin Majestic to stay with her other daughter for several months. Now at home and daughter stays with her on Tuesday and thursdays. Daughter and her live together.  Trying to get a personal care aide and another day program  Got mammogram last year.   Uses O2 but has not had it today because the cannula broke.   HTN - BP stable on lisinopril, coreg, chlorthalidone with Klor-con   Exocrine pancreatic insufficiency - stable on creon also uses imodium occasionally    Dementia - vascular type. Taking  takes namzaric daily. Daughter looking into a different day program  COPD - on albuterol HFA prn (using 3 times a day every day) and spiriva daily, also on 2L [place]  Hyperlipidemia - stable on zocor. LDL 75 in June 2018  CHF- stable, without changes in edema or acute weight gain. Reports more shortness of breath and drop in O2 with  ambulation.  conts on lisinopril, coreg and chlorthalidone.  Following with Dr Einar Gip, needs appt.    Past Medical History:  Diagnosis Date  . CHF (congestive heart failure) ([place])   . COPD (chronic obstructive pulmonary disease) ([place])   . Dementia   . Heart attack ([place])   . History of falling   . Hypertension   . Murmur, heart   . Stroke [facility])     Past Surgical History:  Procedure Laterality Date  . ABDOMINAL HYSTERECTOMY     Partial     Social History   Social History  . Marital status: Single    Spouse name: N/A  . Number of children: 4  . Years of education: N/A   Occupational History  . retired    Social History Main Topics  . Smoking status: Former Smoker    Packs/day: 2.00     Years: 40.00    Types: Cigarettes    Quit date: 01/24/2006  . Smokeless tobacco: Never Used  . Alcohol use No  . Drug use: No  . Sexual activity: No   Other Topics Concern  . None   Social History Narrative  . None    Family History  Problem Relation Age of Onset  . Heart disease Mother   . Diabetes Mother   . Diabetes Father   . Breast cancer Daughter     Review of Systems:  Review of Systems  Unable to perform ROS: Dementia     Allergies as of 09/07/2016   No Known Allergies     Medication List       Accurate as of 09/07/16  1:51 PM. Always use your most recent med list.          acetaminophen 500 MG tablet Commonly known as:  TYLENOL Take 1,000 mg by mouth 3 (three) times daily.   albuterol 108 (90 Base) MCG/ACT inhaler Commonly known as:  PROVENTIL HFA;VENTOLIN HFA Inhale 2 puffs into the lungs every 6 (six) hours as needed for wheezing or shortness of breath.   atorvastatin 10 MG tablet Commonly known as:  LIPITOR Take 10 mg by mouth every evening.   carvedilol 25 MG tablet Commonly known as:  COREG TAKE 1 TABLET (25 MG TOTAL) BY MOUTH 2 (TWO) TIMES DAILY WITH A MEAL.   chlorthalidone 25 MG tablet Commonly known as:  HYGROTON TAKE 1 TABLET (25 MG TOTAL) BY MOUTH EVERY MORNING.   CREON 24000-76000 units Cpep Generic drug:  Pancrelipase (Lip-Prot-Amyl) TAKE ONE CAPSULE BY MOUTH 3 TIMES A DAY WITH MEALS   diclofenac sodium 1 % Gel Commonly known as:  VOLTAREN Apply 2 g topically daily.   KLOR-CON M20 20 MEQ tablet Generic drug:  potassium chloride SA TAKE 1 TABLET EVERY DAY ON MON,WED,FRI AND SAT   lisinopril 40 MG tablet Commonly known as:  PRINIVIL,ZESTRIL TAKE 1 TABLET (40 MG TOTAL) BY MOUTH DAILY.   NAMZARIC 28-10 MG Cp24 Generic drug:  Memantine HCl-Donepezil HCl USE AS DIRECTED 1 CAPSULE IN THE MOUTH OR THROAT DAILY.   SPIRIVA HANDIHALER 18 MCG inhalation capsule Generic drug:  tiotropium PLACE 1 CAPSULE INTO INHALER AND INHALE  DAILY   Zoster Vac Recomb Adjuvanted injection Commonly known as:  SHINGRIX Inject 0.5 mLs into the muscle once.         Physical Exam: Vitals:   09/07/16 1348  BP: 122/72  Pulse: 63  Resp: 17  Temp: 98.1 F (36.7 C)  TempSrc: Oral  SpO2: 95%  Weight: 151 lb 6.4  oz (68.7 kg)  Height: 5\' 2"  (1.575 m)   Body mass index is 27.69 kg/m. Physical Exam  Constitutional: She appears well-developed.  Frail appearing female.  HENT:  Mouth/Throat: Oropharynx is clear and moist. No oropharyngeal exudate.  Eyes: Pupils are equal, round, and reactive to light. No scleral icterus.  Neck: Neck supple. Carotid bruit is not present. No tracheal deviation present. No thyromegaly present.  Cardiovascular: Normal rate and regular rhythm.  Exam reveals no gallop and no friction rub.   Murmur (1/6 SEM) heard.    Pulmonary/Chest: Effort normal. No stridor. No respiratory distress. She has decreased breath sounds (throughtout ). She has no wheezes. She has no rales.  Abdominal: Soft. Bowel sounds are normal. She exhibits no distension. There is no hepatomegaly. There is no tenderness.  Musculoskeletal: She exhibits no edema or tenderness.  Lymphadenopathy:    She has no cervical adenopathy.  Neurological: She is alert.  oriented to name only  Skin: Skin is warm and dry. No rash noted.  Psychiatric: She has a normal mood and affect. Her behavior is normal.    Labs reviewed: Basic Metabolic Panel:  Recent Labs  06/11/16 1410 06/29/16 0942  NA 147* 142  K 5.2* 4.2  CL 110 105  CO2 23 25  GLUCOSE 116* 116*  BUN 49* 21  CREATININE 1.97* 1.25*  CALCIUM 9.6 9.5   Liver Function Tests:  Recent Labs  06/29/16 0942  AST 14  ALT 9  ALKPHOS 62  BILITOT 0.4  PROT 7.9  ALBUMIN 3.9   No results for input(s): LIPASE, AMYLASE in the last 8760 hours. No results for input(s): AMMONIA in the last 8760 hours. CBC:  Recent Labs  06/11/16 1410 06/29/16 0942  WBC 15.0* 8.8    NEUTROABS 11.5* 5,808  HGB 13.1 12.4  HCT 43.4 40.2  MCV 88.9 83.2  PLT 239 294   Lipid Panel:  Recent Labs  06/29/16 0942  CHOL 171  HDL 79  LDLCALC 75  TRIG 87  CHOLHDL 2.2   No results found for: HGBA1C  Procedures: No results found.  Assessment/Plan 1. Estrogen deficiency - DG Bone Density; Future  2. Vascular dementia with behavioral disturbance Progressive memory loss. conts on namzaric.  - [facility] referral to [facility] to help with adult day programs for supervision and meals/medication.   3. Exocrine pancreatic insufficiency -stable on creon.   4. Chronic obstructive pulmonary disease, unspecified COPD type ([place]) COPD remains stable. conts on spiriva with albuterol PRN  5. Essential hypertension Stable on coreg, lisinopril, chlorthalidone. Will cont current regimen.  Over due for follow up with cardiologist, plans to see them due to hx of CHF.   6. Acute right ankle pain Improved  7. CKD (chronic kidney disease) stage 3, GFR 30-59 ml/min Stable. Encourage proper hydration and to avoid NSAIDS (Aleve, Advil, Motrin, Ibuprofen)   8. Mixed hyperlipidemia LDL 75, stable. Will cont Lipitor at this time.  - atorvastatin (LIPITOR) 10 MG tablet; Take 1 tablet (10 mg total) by mouth every evening.  Dispense: 90 tablet; Refill: 1  9. Weight loss Encouraged 3 meals a day, to increase protein in diet/supplements.   Total time 45 mins:  time greater than 50% of total time spent doing pt counseling and coordination of care  Next appt: 4 months, sooner if needed Denzell Colasanti K. Harle Battiest  [facility] [phone] 8 am - 5 pm) [phone] (after hours)

## 2016-09-12 ENCOUNTER — Other Ambulatory Visit: Payer: Self-pay | Admitting: Nurse Practitioner

## 2016-09-22 ENCOUNTER — Other Ambulatory Visit: Payer: Self-pay | Admitting: Nurse Practitioner

## 2016-09-22 DIAGNOSIS — Z1231 Encounter for screening mammogram for malignant neoplasm of breast: Secondary | ICD-10-CM

## 2016-09-28 ENCOUNTER — Telehealth: Payer: Self-pay

## 2016-09-28 DIAGNOSIS — F0151 Vascular dementia with behavioral disturbance: Secondary | ICD-10-CM | POA: Diagnosis not present

## 2016-09-28 DIAGNOSIS — M25571 Pain in right ankle and joints of right foot: Secondary | ICD-10-CM | POA: Diagnosis not present

## 2016-09-28 DIAGNOSIS — E785 Hyperlipidemia, unspecified: Secondary | ICD-10-CM | POA: Diagnosis not present

## 2016-09-28 DIAGNOSIS — J449 Chronic obstructive pulmonary disease, unspecified: Secondary | ICD-10-CM | POA: Diagnosis not present

## 2016-09-28 DIAGNOSIS — I110 Hypertensive heart disease with heart failure: Secondary | ICD-10-CM | POA: Diagnosis not present

## 2016-09-28 DIAGNOSIS — I509 Heart failure, unspecified: Secondary | ICD-10-CM | POA: Diagnosis not present

## 2016-09-28 NOTE — Telephone Encounter (Signed)
Speech Therapist Vaughan Basta called to inform Carol Chandler, NP that patient is being discharged from physical therapy and speech therapy, patient has met goals.  FYI message, no response require

## 2016-09-29 ENCOUNTER — Ambulatory Visit: Payer: Medicare Other | Admitting: Nurse Practitioner

## 2016-10-06 ENCOUNTER — Telehealth: Payer: Self-pay | Admitting: Nurse Practitioner

## 2016-10-06 NOTE — Telephone Encounter (Signed)
I left a message for patient's daughter asking her to call me about community resource referral for her mom. VDM (DD)

## 2016-10-10 ENCOUNTER — Encounter: Payer: Self-pay | Admitting: Nurse Practitioner

## 2016-10-10 ENCOUNTER — Other Ambulatory Visit: Payer: Medicare Other

## 2016-10-10 ENCOUNTER — Ambulatory Visit: Payer: Medicare Other

## 2016-10-12 ENCOUNTER — Institutional Professional Consult (permissible substitution): Payer: Medicare Other | Admitting: Pulmonary Disease

## 2016-11-13 ENCOUNTER — Other Ambulatory Visit: Payer: Self-pay | Admitting: Nurse Practitioner

## 2016-11-14 ENCOUNTER — Institutional Professional Consult (permissible substitution): Payer: Medicare Other | Admitting: Pulmonary Disease

## 2016-11-15 ENCOUNTER — Encounter: Payer: Self-pay | Admitting: Pulmonary Disease

## 2016-11-15 ENCOUNTER — Ambulatory Visit (INDEPENDENT_AMBULATORY_CARE_PROVIDER_SITE_OTHER): Payer: Medicare Other | Admitting: Pulmonary Disease

## 2016-11-15 ENCOUNTER — Ambulatory Visit (INDEPENDENT_AMBULATORY_CARE_PROVIDER_SITE_OTHER)
Admission: RE | Admit: 2016-11-15 | Discharge: 2016-11-15 | Disposition: A | Discharge status: Home / Self Care | Payer: Medicare Other | Source: Ambulatory Visit | Attending: Pulmonary Disease | Admitting: Pulmonary Disease

## 2016-11-15 VITALS — BP 124/74 | HR 76 | Ht 62.0 in | Wt 152.0 lb

## 2016-11-15 DIAGNOSIS — R0602 Shortness of breath: Secondary | ICD-10-CM

## 2016-11-15 MED ORDER — TIOTROPIUM BROMIDE-OLODATEROL 2.5-2.5 MCG/ACT IN AERS: 2.0000 | INHALATION_SPRAY | Freq: Every day | RESPIRATORY_TRACT | 0 refills | Status: DC

## 2016-11-15 MED ORDER — TIOTROPIUM BROMIDE-OLODATEROL 2.5-2.5 MCG/ACT IN AERS: 2.0000 | INHALATION_SPRAY | Freq: Every day | RESPIRATORY_TRACT | 5 refills | Status: AC

## 2016-11-15 NOTE — Patient Instructions (Signed)
We will get a chest x-ray today and schedule you for pulmonary function test We will update her oxygen in order to show that he needs 3 L oxygen Stop the Spiriva.  We will start you on stiolto.  Follow up in 1-2 months.

## 2016-11-15 NOTE — Progress Notes (Signed)
Carol Monroe    [id number]    [birth year]  Primary Care Physician:Eubanks, Carlos American, NP  Referring Physician: Lauree Chandler, NP [place], [place] [address]  Chief complaint: Consult for evaluation of COPD  HPI: 76 year old with history of CHF, COPD, hypertension, dementia.  She was diagnosed with COPD in 2011 in [place]. She moved to [facility] in 2014 and has not followed up with a pulmonologist She has chronic dyspnea on exertion, denies any cough, sputum production.  She is maintained on Spiriva and albuterol.  She was started on supplemental oxygen at 2 L 3 months ago for hypoxia.    Occupation: Homemaker Exposures: No known exposures Smoking history: 40-60-pack-year smoking history.  Quit in 2008 Travel History: Lived in [place] in [place].  No significant travel history.  Outpatient Encounter Prescriptions as of 11/15/2016  Medication Sig  . albuterol (PROVENTIL HFA;VENTOLIN HFA) 108 (90 Base) MCG/ACT inhaler Inhale 2 puffs into the lungs every 6 (six) hours as needed for wheezing or shortness of breath.  Marland Kitchen atorvastatin (LIPITOR) 10 MG tablet Take 1 tablet (10 mg total) by mouth every evening.  . carvedilol (COREG) 25 MG tablet TAKE 1 TABLET (25 MG TOTAL) BY MOUTH 2 (TWO) TIMES DAILY WITH A MEAL.  . chlorthalidone (HYGROTON) 25 MG tablet TAKE 1 TABLET (25 MG TOTAL) BY MOUTH EVERY MORNING.  Marland Kitchen CREON 24000-76000 units CPEP TAKE ONE CAPSULE BY MOUTH 3 TIMES A DAY WITH MEALS  . diclofenac sodium (VOLTAREN) 1 % GEL Apply 2 g topically daily.  Marland Kitchen KLOR-CON M20 20 MEQ tablet TAKE 1 TABLET EVERY DAY ON MON,WED,FRI AND SAT  . lisinopril (PRINIVIL,ZESTRIL) 40 MG tablet TAKE 1 TABLET (40 MG TOTAL) BY MOUTH DAILY.  Marland Kitchen NAMZARIC 28-10 MG CP24 USE AS DIRECTED 1 CAPSULE IN THE MOUTH OR THROAT DAILY.  Marland Kitchen SPIRIVA HANDIHALER 18 MCG inhalation capsule PLACE 1 CAPSULE INTO INHALER AND INHALE DAILY  . [DISCONTINUED] acetaminophen (TYLENOL) 500 MG tablet Take 1,000 mg by  mouth 3 (three) times daily.   No facility-administered encounter medications on file as of 11/15/2016.     Allergies as of 11/15/2016  . (No Known Allergies)    Past Medical History:  Diagnosis Date  . CHF (congestive heart failure) ([place])   . COPD (chronic obstructive pulmonary disease) ([place])   . Dementia   . Heart attack ([place])   . History of falling   . Hypertension   . Murmur, heart   . Stroke [facility])     Past Surgical History:  Procedure Laterality Date  . ABDOMINAL HYSTERECTOMY     Partial     Family History  Problem Relation Age of Onset  . Heart disease Mother   . Diabetes Mother   . Diabetes Father   . Breast cancer Daughter     Social History   Social History  . Marital status: Single    Spouse name: N/A  . Number of children: 4  . Years of education: N/A   Occupational History  . retired    Social History Main Topics  . Smoking status: Former Smoker    Packs/day: 2.00    Years: 40.00    Types: Cigarettes    Quit date: 01/24/2006  . Smokeless tobacco: Never Used  . Alcohol use No  . Drug use: No  . Sexual activity: No   Other Topics Concern  . Not on file   Social History Narrative  . No narrative  on file    Review of systems: Review of Systems  Constitutional: Negative for fever and chills.  HENT: Negative.   Eyes: Negative for blurred vision.  Respiratory: as per HPI  Cardiovascular: Negative for chest pain and palpitations.  Gastrointestinal: Negative for vomiting, diarrhea, blood per rectum. Genitourinary: Negative for dysuria, urgency, frequency and hematuria.  Musculoskeletal: Negative for myalgias, back pain and joint pain.  Skin: Negative for itching and rash.  Neurological: Negative for dizziness, tremors, focal weakness, seizures and loss of consciousness.  Endo/Heme/Allergies: Negative for environmental allergies.  Psychiatric/Behavioral: Negative for depression, suicidal ideas and hallucinations.  All other systems  reviewed and are negative.  Physical Exam: Blood pressure 124/74, pulse 76, height 5\' 2"  (1.575 m), weight 68.9 kg (152 lb), SpO2 96 %. Gen:      No acute distress HEENT:  EOMI, sclera anicteric Neck:     No masses; no thyromegaly Lungs:    Clear to auscultation bilaterally; normal respiratory effort CV:         Regular rate and rhythm; no murmurs Abd:      + bowel sounds; soft, non-tender; no palpable masses, no distension Ext:    No edema; adequate peripheral perfusion Skin:      Warm and dry; no rash Neuro: alert and oriented x 3 Psych: normal mood and affect  Data Reviewed: Chest x-ray 07/29/14-hyperexpanded lungs.  No infiltrate. I have reviewed the images personally.    Assessment:  COPD Likely has severe COPD from smoking history and hypoxia We walked her in the office today and she requires 3 L oxygen.  We will change her oxygen order to reflect this She will benefit from optimization of her bronchodilators.  I will stop the Spiriva and start her on stiolto Get a chest x-ray and pulmonary function test today for baseline evaluation.  Plan/Recommendations: - X-ray, PFTs - Increase supplemental oxygen to 3 L - Stop Spiriva, start stiolto  Marshell Garfinkel MD  Pulmonary and Critical Care Pager [phone] 11/15/2016, 4:39 PM  CC: Lauree Chandler, NP

## 2016-12-05 ENCOUNTER — Other Ambulatory Visit: Payer: Self-pay | Admitting: Nurse Practitioner

## 2016-12-11 ENCOUNTER — Other Ambulatory Visit: Payer: Self-pay | Admitting: *Deleted

## 2016-12-11 DIAGNOSIS — E782 Mixed hyperlipidemia: Secondary | ICD-10-CM

## 2016-12-11 MED ORDER — ATORVASTATIN CALCIUM 10 MG PO TABS: 10.0000 mg | ORAL_TABLET | Freq: Every evening | ORAL | 1 refills | Status: AC

## 2016-12-11 NOTE — Telephone Encounter (Signed)
[facility] 

## 2016-12-28 ENCOUNTER — Ambulatory Visit (INDEPENDENT_AMBULATORY_CARE_PROVIDER_SITE_OTHER): Payer: Medicare Other | Admitting: Pulmonary Disease

## 2016-12-28 ENCOUNTER — Encounter: Payer: Self-pay | Admitting: Pulmonary Disease

## 2016-12-28 VITALS — BP 120/76 | HR 87 | Ht 62.0 in | Wt 145.0 lb

## 2016-12-28 DIAGNOSIS — J449 Chronic obstructive pulmonary disease, unspecified: Secondary | ICD-10-CM

## 2016-12-28 DIAGNOSIS — I639 Cerebral infarction, unspecified: Secondary | ICD-10-CM

## 2016-12-28 DIAGNOSIS — R0602 Shortness of breath: Secondary | ICD-10-CM | POA: Diagnosis not present

## 2016-12-28 LAB — PULMONARY FUNCTION TEST
DL/VA % pred: 52 %
DL/VA: 2.4 ml/min/mmHg/L
DLCO COR % PRED: 37 %
DLCO COR: 8.17 ml/min/mmHg
DLCO UNC % PRED: 35 %
DLCO unc: 7.68 ml/min/mmHg
FEF 25-75 Post: 0.19 L/sec
FEF 25-75 Pre: 0.36 L/sec
FEF2575-%CHANGE-POST: -46 %
FEF2575-%Pred-Post: 13 %
FEF2575-%Pred-Pre: 26 %
FEV1-%Change-Post: -16 %
FEV1-%PRED-PRE: 53 %
FEV1-%Pred-Post: 44 %
FEV1-Post: 0.68 L
FEV1-Pre: 0.81 L
FEV1FVC-%Change-Post: 3 %
FEV1FVC-%Pred-Pre: 63 %
FEV6-%CHANGE-POST: -17 %
FEV6-%PRED-POST: 70 %
FEV6-%PRED-PRE: 85 %
FEV6-POST: 1.34 L
FEV6-PRE: 1.62 L
FEV6FVC-%CHANGE-POST: 2 %
FEV6FVC-%PRED-POST: 104 %
FEV6FVC-%PRED-PRE: 101 %
FVC-%Change-Post: -19 %
FVC-%Pred-Post: 68 %
FVC-%Pred-Pre: 84 %
FVC-Post: 1.35 L
FVC-Pre: 1.68 L
POST FEV6/FVC RATIO: 99 %
Post FEV1/FVC ratio: 50 %
Pre FEV1/FVC ratio: 48 %
Pre FEV6/FVC Ratio: 97 %

## 2016-12-28 NOTE — Addendum Note (Signed)
Addended by: Maryanna Shape A on: 12/28/2016 02:07 PM   Modules accepted: Orders

## 2016-12-28 NOTE — Progress Notes (Signed)
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  She was diagnosed with COPD in 2011 in [place]. She moved to [facility] in 2014 and has not followed up with a pulmonologist She has chronic dyspnea on exertion, denies any cough, sputum production.  She is maintained on Spiriva and albuterol.  She was started on supplemental oxygen at 2 L 3 months ago for hypoxia.    Occupation: Homemaker Exposures: No known exposures Smoking history: 40-60-pack-year smoking history.  Quit in 2008 Travel History: Lived in [place] in [place].  No significant travel history.  Interim history: At last visit Spiriva was stopped and she was started on stiolto.  She states that the breathing has improved on the new inhaler.  She has chronic dyspnea on exertion at baseline, no cough, sputum production, wheezing.  Outpatient Encounter Medications as of 12/28/2016  Medication Sig  . albuterol (PROVENTIL HFA;VENTOLIN HFA) 108 (90 Base) MCG/ACT inhaler Inhale 2 puffs into the lungs every 6 (six) hours as needed for wheezing or shortness of breath.  Marland Kitchen atorvastatin (LIPITOR) 10 MG tablet Take 1 tablet (10 mg total) every evening by mouth.  . carvedilol (COREG) 25 MG tablet TAKE 1 TABLET (25 MG TOTAL) BY MOUTH 2 (TWO) TIMES DAILY WITH A MEAL.  . chlorthalidone (HYGROTON) 25 MG tablet TAKE 1 TABLET (25 MG TOTAL) BY MOUTH EVERY MORNING.  Marland Kitchen CREON 24000-76000 units CPEP TAKE ONE CAPSULE BY MOUTH 3 TIMES A DAY WITH MEALS  . diclofenac sodium (VOLTAREN) 1 % GEL Apply 2 g topically daily.  Marland Kitchen KLOR-CON M20 20 MEQ tablet TAKE 1 TABLET EVERY DAY ON MON,WED,FRI AND SAT  . lisinopril (PRINIVIL,ZESTRIL) 40 MG tablet TAKE 1 TABLET (40 MG  TOTAL) BY MOUTH DAILY.  Marland Kitchen NAMZARIC 28-10 MG CP24 USE AS DIRECTED 1 CAPSULE IN THE MOUTH OR THROAT DAILY.  Marland Kitchen Tiotropium Bromide-Olodaterol (STIOLTO RESPIMAT) 2.5-2.5 MCG/ACT AERS Inhale 2 puffs into the lungs daily.  . [DISCONTINUED] SPIRIVA HANDIHALER 18 MCG inhalation capsule PLACE 1 CAPSULE INTO INHALER AND INHALE DAILY   No facility-administered encounter medications on file as of 12/28/2016.     Allergies as of 12/28/2016  . (No Known Allergies)    Past Medical History:  Diagnosis Date  . CHF (congestive heart failure) ([place])   . COPD (chronic obstructive pulmonary disease) ([place])   . Dementia   . Heart attack ([place])   . History of falling   . Hypertension   . Murmur, heart   . Stroke [facility])     Past Surgical History:  Procedure Laterality Date  . ABDOMINAL HYSTERECTOMY     Partial     Family History  Problem Relation Age of Onset  . Heart disease Mother   . Diabetes Mother   . Diabetes Father   . Breast cancer Daughter     Social History   Socioeconomic History  . Marital status: Single    Spouse name: Not on file  . Number of children: 4  . Years of education: Not on file  . Highest education level: Not on file  Social Needs  . Financial resource strain: Not on file  . Food insecurity - worry: Not on file  .  Food insecurity - inability: Not on file  . Transportation needs - medical: Not on file  . Transportation needs - non-medical: Not on file  Occupational History  . Occupation: retired  Tobacco Use  . Smoking status: Former Smoker    Packs/day: 2.00    Years: 40.00    Pack years: 80.00    Types: Cigarettes    Last attempt to quit: 01/24/2006    Years since quitting: 10.9  . Smokeless tobacco: Never Used  Substance and Sexual Activity  . Alcohol use: No    Alcohol/week: 0.0 oz  . Drug use: No  . Sexual activity: No  Other Topics Concern  . Not on file  Social History Narrative  . Not on file    Review of systems: Review of Systems    Constitutional: Negative for fever and chills.  HENT: Negative.   Eyes: Negative for blurred vision.  Respiratory: as per HPI  Cardiovascular: Negative for chest pain and palpitations.  Gastrointestinal: Negative for vomiting, diarrhea, blood per rectum. Genitourinary: Negative for dysuria, urgency, frequency and hematuria.  Musculoskeletal: Negative for myalgias, back pain and joint pain.  Skin: Negative for itching and rash.  Neurological: Negative for dizziness, tremors, focal weakness, seizures and loss of consciousness.  Endo/Heme/Allergies: Negative for environmental allergies.  Psychiatric/Behavioral: Negative for depression, suicidal ideas and hallucinations.  All other systems reviewed and are negative.  Physical Exam: Blood pressure 120/76, pulse 87, height 5\' 2"  (1.575 m), weight 145 lb (65.8 kg), SpO2 93 %. Gen:      No acute distress HEENT:  EOMI, sclera anicteric Neck:     No masses; no thyromegaly Lungs:    Clear to auscultation bilaterally; normal respiratory effort CV:         Regular rate and rhythm; no murmurs Abd:      + bowel sounds; soft, non-tender; no palpable masses, no distension Ext:    No edema; adequate peripheral perfusion Skin:      Warm and dry; no rash Neuro: alert and oriented x 3 Psych: normal mood and affect  Data Reviewed: Chest x-ray 07/29/14-hyperexpanded lungs.  No infiltrate. Chest x-ray 11/15/16-lingular scarring, left basilar atelectasis, hyperinflation I have reviewed the images personally  PFTs 12/28/16 FVC 1.68 [84%], FEV1 1.81 [53%], F/F 48, SVC 82%, DLCO 35% Moderate-severe obstruction with severe diffusion impairment Could not complete post spirometry due to fatigue and shortness of breath  Assessment:  COPD GOLD B We walked her in the office today and she requires 2 L oxygen.  Order oxygen and a portable concentrator She is doing well on stiolto and will continue the same Refer to pulmonary rehab  Plan/Recommendations: -  Continue stiolto - Supplemental O2, Portable concentrator - Pulmonary rehab  Marshell Garfinkel MD [place] Pulmonary and Critical Care Pager [phone] 12/28/2016, 1:35 PM  CC: Lauree Chandler, NP

## 2016-12-28 NOTE — Progress Notes (Signed)
PFT done today. 

## 2016-12-28 NOTE — Patient Instructions (Signed)
Continue stiolto We will place order for portable oxygen machine Use oxygen during exercise and at night We will refer you to the pulmonary rehab Follow-up in 6 months

## 2016-12-28 NOTE — Addendum Note (Signed)
Addended by: Maryanna Shape A on: 12/28/2016 02:15 PM   Modules accepted: Orders

## 2017-01-03 ENCOUNTER — Telehealth (HOSPITAL_COMMUNITY): Payer: Self-pay

## 2017-01-03 NOTE — Telephone Encounter (Signed)
Attempted to call patient in regards to Pulmonary Rehab - Called patient to verify which type of insurance patient currently has and if patient has done program before. Patient's voicemail box is full.

## 2017-01-04 ENCOUNTER — Other Ambulatory Visit: Payer: Self-pay | Admitting: Nurse Practitioner

## 2017-01-04 DIAGNOSIS — I509 Heart failure, unspecified: Secondary | ICD-10-CM

## 2017-01-04 DIAGNOSIS — I10 Essential (primary) hypertension: Secondary | ICD-10-CM

## 2017-01-11 ENCOUNTER — Ambulatory Visit: Payer: Medicare Other | Admitting: Internal Medicine

## 2017-01-11 ENCOUNTER — Telehealth (HOSPITAL_COMMUNITY): Payer: Self-pay

## 2017-01-11 NOTE — Telephone Encounter (Signed)
Called and spoke with daughter of patient in regards to Pulmonary Rehab - Patient has never done Pulmonary rehab. Patient is interested in program. Scheduled orientation on 02/16/2017 at 9:30am. Patient will attend the 1:30pm exc class.

## 2017-01-17 ENCOUNTER — Other Ambulatory Visit: Payer: Self-pay | Admitting: *Deleted

## 2017-01-17 MED ORDER — CHLORTHALIDONE 25 MG PO TABS: ORAL_TABLET | ORAL | 3 refills | Status: AC

## 2017-01-17 NOTE — Telephone Encounter (Signed)
[facility] 

## 2017-02-02 NOTE — Addendum Note (Signed)
Addended by: Maryanna Shape A on: 02/02/2017 02:51 PM   Modules accepted: Orders

## 2017-02-05 ENCOUNTER — Telehealth: Payer: Self-pay | Admitting: Pulmonary Disease

## 2017-02-07 NOTE — Telephone Encounter (Signed)
Spoke with pt's daughter and advised her that the order was sent for POC and [facility] has received it. I sent a community message to [facility] to check status. Will await response.

## 2017-02-16 ENCOUNTER — Encounter (HOSPITAL_COMMUNITY): Payer: Self-pay

## 2017-02-16 ENCOUNTER — Encounter (HOSPITAL_COMMUNITY)
Admission: RE | Admit: 2017-02-16 | Discharge: 2017-02-16 | Disposition: A | Discharge status: Home / Self Care | Payer: Medicare Other | Source: Ambulatory Visit | Attending: Pulmonary Disease | Admitting: Pulmonary Disease

## 2017-02-16 VITALS — BP 136/97 | HR 67 | Resp 18 | Ht 61.0 in | Wt 149.0 lb

## 2017-02-16 DIAGNOSIS — J449 Chronic obstructive pulmonary disease, unspecified: Secondary | ICD-10-CM | POA: Insufficient documentation

## 2017-02-16 NOTE — Progress Notes (Signed)
Pulmonary Rehab Initial Note  S: no events, no new complaints  O: Chronically ill appearing female, NAD  Heart: RRR, Nl S1/S2 and -M/R/G.  Lung: CTA bilaterally  Abdomen: Soft, NT, ND and +BS  Ext: -edema and -tenderness  Neuro: confused at time but moving all ext to command.  A/P: 77 year old gold stage 3 COPD presenting for pulmonary rehab.  Obstacles will be dementia as patient will need to be guided from machine to machine and deal with the incontinence issues.  Staff is well aware of that and will assist.  Ok to proceed with planned exercises from a pulmonary standpoint.  Rush Farmer, M.D. [facility] Pulmonary/Critical Care Medicine. Pager: [phone]. After hours pager: [phone].

## 2017-02-16 NOTE — Progress Notes (Signed)
Carol Monroe 77 y.o. female Pulmonary Rehab Orientation Note Patient arrived today in Cardiac and Pulmonary Rehab for orientation to Pulmonary Rehab. She was transported from [employer] via wheel chair accompanied by her daughter. She does not carry portable oxygen. Per pt, she uses oxygen continuously at night and with exercise, both at 2 liters. Color good, skin warm and dry. Patient is oriented to place however she has significant dementia. She was unable to tell me how long she has lived in [place] with her daughter, if her home was one or two levels, what medications she took, if any, her past medical history, how many grandchildren she had. Her daughter states patient will not be able to remember her exercise prescription and will need directing in the gym. She also states that after her mother is placed on a piece of equipment, she will be able to participate. Patient is also incontinent of urine but it is controlled with depends. Also discussed with daughter and patient the need for the patient to be able to verbalize any discomforts she may experience during exercise such as shortness of breath or chest pain. Patient daughter feels her mother will be able to do this however understands because pulmonary rehab is a group exercise program and not a 1:1 rehab, the patients participation is on a trial basis. Patient's medical history, psychosocial health, and medications reviewed with her daughter. Psychosocial assessment reveals pt lives with their daughter. Pt is currently retired. The patient states her hobby is watching TV. The daughter states the patient has to be prompted to bathe, dress, and leave her room which she often refuses. I questioned if she felt her mother was exhibiting depression symptoms and she said possibly. The patient states she missed being in [place] with her other daughters. It was a family decision to bring the patient to [place] to live because her local daughter had more experience dealing  with demented patients and had a more flexible job. Pt reports her stress level is low although she stated multiple times that she missed her girls in [place] and her grandchildren. Hard to assess if patient displays depressive symptoms as many of them coincide with her dementia.PHQ2/9 score 0/na. Unsure if patient really understood PHQ questions. Pt shows fair  coping skills with not done outlook. Patient and daughter offered emotional support and reassurance. If patients dementia worsens the patient will be moved back to [place] into a familiar setting. Will continue to monitor and evaluate progress toward psychosocial goal(s) of maintaining patient current stated of cognition. Physical assessment reveals heart rate is normal, breath sounds clear to auscultation, no wheezes, rales, or rhonchi. Grip strength equal, strong. Distal pulses palpable. Patient daughter reports she does take medications as prescribed. Patient daughter states she follows a Regular diet. The patient reports no specific efforts to gain or lose weight. Patient's weight will be monitored closely. Demonstration and practice of PLB using pulse oximeter. Patient able to return demonstration satisfactorily. Safety and hand hygiene in the exercise area reviewed with patient. Patient voices understanding of the information reviewed. Department expectations discussed with patient and achievable goals were set. The patient shows enthusiasm about attending the program and we look forward to working with this nice lady. The patient is scheduled for a 6 min walk test on 02/20/17 and to begin exercise on 02/27/17 at 1:30.   45 minutes was spent on a variety of activities such as assessment of the patient, obtaining baseline data including height, weight, BMI, and grip strength,  verifying medical history, allergies, and current medications, and teaching patient strategies for performing tasks with less respiratory effort with emphasis on pursed lip breathing.

## 2017-02-19 ENCOUNTER — Other Ambulatory Visit: Payer: Self-pay | Admitting: Nurse Practitioner

## 2017-02-19 DIAGNOSIS — F01518 Vascular dementia, unspecified severity, with other behavioral disturbance: Secondary | ICD-10-CM

## 2017-02-19 DIAGNOSIS — F0151 Vascular dementia with behavioral disturbance: Secondary | ICD-10-CM

## 2017-02-20 ENCOUNTER — Encounter (HOSPITAL_COMMUNITY)
Admission: RE | Admit: 2017-02-20 | Discharge: 2017-02-20 | Disposition: A | Discharge status: Home / Self Care | Payer: Medicare Other | Source: Ambulatory Visit | Attending: Pulmonary Disease | Admitting: Pulmonary Disease

## 2017-02-20 DIAGNOSIS — J449 Chronic obstructive pulmonary disease, unspecified: Secondary | ICD-10-CM | POA: Diagnosis not present

## 2017-02-20 NOTE — Progress Notes (Signed)
Pulmonary Individual Treatment Plan  Patient Details  Name: Carol Monroe MRN: [id number] Date of Birth: Jan 07, 1941 Referring Provider:     Pulmonary Rehab Walk Test from 02/20/2017 in [place]  Referring Provider  Dr. Vaughan Browner      Initial Encounter Date:    Pulmonary Rehab Walk Test from 02/20/2017 in [place]  Date  02/13/17  Referring Provider  Dr. Vaughan Browner      Visit Diagnosis: Chronic obstructive pulmonary disease, unspecified COPD type ([place])  Patient's Home Medications on Admission:   Current Outpatient Medications:  .  albuterol (PROVENTIL HFA;VENTOLIN HFA) 108 (90 Base) MCG/ACT inhaler, Inhale 2 puffs into the lungs every 6 (six) hours as needed for wheezing or shortness of breath., Disp: 1 Inhaler, Rfl: 2 .  atorvastatin (LIPITOR) 10 MG tablet, Take 1 tablet (10 mg total) every evening by mouth., Disp: 90 tablet, Rfl: 1 .  carvedilol (COREG) 25 MG tablet, TAKE 1 TABLET (25 MG TOTAL) BY MOUTH 2 (TWO) TIMES DAILY WITH A MEAL., Disp: 180 tablet, Rfl: 1 .  chlorthalidone (HYGROTON) 25 MG tablet, Take one tablet by mouth once every morning, Disp: 90 tablet, Rfl: 3 .  CREON 24000-76000 units CPEP, TAKE ONE CAPSULE BY MOUTH 3 TIMES A DAY WITH MEALS, Disp: 90 capsule, Rfl: 1 .  diclofenac sodium (VOLTAREN) 1 % GEL, Apply 2 g topically daily., Disp: , Rfl:  .  KLOR-CON M20 20 MEQ tablet, TAKE 1 TABLET EVERY DAY ON MON,WED,FRI AND SAT, Disp: 48 tablet, Rfl: 1 .  lisinopril (PRINIVIL,ZESTRIL) 40 MG tablet, TAKE 1 TABLET BY MOUTH EVERY DAY, Disp: 30 tablet, Rfl: 3 .  NAMZARIC 28-10 MG CP24, USE AS DIRECTED 1 CAPSULE IN THE MOUTH OR THROAT DAILY., Disp: 90 capsule, Rfl: 1 .  Tiotropium Bromide-Olodaterol (STIOLTO RESPIMAT) 2.5-2.5 MCG/ACT AERS, Inhale 2 puffs into the lungs daily., Disp: 1 Inhaler, Rfl: 5  Past Medical History: Past Medical History:  Diagnosis Date  . CHF (congestive heart failure) ([place])   . COPD (chronic  obstructive pulmonary disease) ([place])   . Dementia   . Heart attack ([place])   . History of falling   . Hypertension   . Murmur, heart   . Stroke [facility])     Tobacco Use: Social History   Tobacco Use  Smoking Status Former Smoker  . Packs/day: 2.00  . Years: 40.00  . Pack years: 80.00  . Types: Cigarettes  . Last attempt to quit: 01/24/2006  . Years since quitting: 11.0  Smokeless Tobacco Never Used    Labs: Recent Review Flowsheet Data    Labs for ITP Cardiac and Pulmonary Rehab Latest Ref Rng & Units 10/16/2012 07/10/2013 08/20/2014 08/26/2015 06/29/2016   Cholestrol <200 mg/dL 161 158 162 162 171   LDLCALC <100 mg/dL 70 57 62 65 75   HDL >50 mg/dL 75 90 85 80 79   Trlycerides <150 mg/dL 80 53 73 86 87      Capillary Blood Glucose: No results found for: GLUCAP   Pulmonary Assessment Scores: Pulmonary Assessment Scores    Row Name 02/20/17 1617         ADL UCSD   ADL Phase  Entry       mMRC Score   mMRC Score  3        Pulmonary Function Assessment: Pulmonary Function Assessment - 02/16/17 1109      Breath   Bilateral Breath Sounds  Clear    Shortness of Breath  Yes;Limiting  activity       Exercise Target Goals: Date: 02/13/17  Exercise Program Goal: Individual exercise prescription set using results from initial 6 min walk test and THRR while considering  patient's activity barriers and safety.    Exercise Prescription Goal: Initial exercise prescription builds to 30-45 minutes a day of aerobic activity, 2-3 days per week.  Home exercise guidelines will be given to patient during program as part of exercise prescription that the participant will acknowledge.  Activity Barriers & Risk Stratification: Activity Barriers & Cardiac Risk Stratification - 02/16/17 1049      Activity Barriers & Cardiac Risk Stratification   Activity Barriers  Deconditioning;Shortness of Breath;History of Falls       6 Minute Walk: 6 Minute Walk    Row Name 02/20/17 1611          6 Minute Walk   Phase  Initial     Distance  700 feet     Walk Time  6 minutes     # of Rest Breaks  0     MPH  1.32     METS  2     RPE  13     Perceived Dyspnea   1     Symptoms  Yes (comment)     Comments  leg fatigue, used wheelchair, not a reliable pulse ox reading     Resting HR  64 bpm     Resting BP  124/82     Resting Oxygen Saturation   95 %     Exercise Oxygen Saturation  during 6 min walk  91 %     Max Ex. HR  99 bpm     Max Ex. BP  146/77     2 Minute Post BP  146/86       Interval HR   1 Minute HR  92     2 Minute HR  94     4 Minute HR  97     5 Minute HR  99     6 Minute HR  96     2 Minute Post HR  67     Interval Heart Rate?  Yes       Interval Oxygen   Interval Oxygen?  Yes     Baseline Oxygen Saturation %  995 %     1 Minute Oxygen Saturation %  85 %     1 Minute Liters of Oxygen  2 L     2 Minute Oxygen Saturation %  91 %     2 Minute Liters of Oxygen  3 L     3 Minute Oxygen Saturation %  3 %     3 Minute Liters of Oxygen  3 L     4 Minute Oxygen Saturation %  94 %     4 Minute Liters of Oxygen  3 L     5 Minute Oxygen Saturation %  97 %     5 Minute Liters of Oxygen  3 L     6 Minute Oxygen Saturation %  95 %     6 Minute Liters of Oxygen  3 L     2 Minute Post Oxygen Saturation %  98 %     2 Minute Post Liters of Oxygen  3 L        Oxygen Initial Assessment: Oxygen Initial Assessment - 02/20/17 1616      Initial 6 min Walk   Oxygen Used  Continuous;E-Tanks    Liters per minute  2 2-3      Program Oxygen Prescription   Program Oxygen Prescription  Continuous;E-Tanks    Liters per minute  -- 2-3      Intervention   Short Term Goals  To learn and exhibit compliance with exercise, home and travel O2 prescription;To learn and understand importance of monitoring SPO2 with pulse oximeter and demonstrate accurate use of the pulse oximeter.;To learn and understand importance of maintaining oxygen saturations>88%;To learn and  demonstrate proper pursed lip breathing techniques or other breathing techniques.;To learn and demonstrate proper use of respiratory medications    Long  Term Goals  Exhibits compliance with exercise, home and travel O2 prescription;Verbalizes importance of monitoring SPO2 with pulse oximeter and return demonstration;Maintenance of O2 saturations>88%;Exhibits proper breathing techniques, such as pursed lip breathing or other method taught during program session;Compliance with respiratory medication;Demonstrates proper use of MDI's       Oxygen Re-Evaluation:   Oxygen Discharge (Final Oxygen Re-Evaluation):   Initial Exercise Prescription: Initial Exercise Prescription - 02/20/17 1600      Date of Initial Exercise RX and Referring Provider   Date  02/13/17    Referring Provider  Dr. Vaughan Browner      Oxygen   Oxygen  Continuous    Liters  -- 2-3      Recumbant Bike   Level  1    Minutes  17      NuStep   Level  2    SPM  80    Minutes  17    METs  1.5      Track   Laps  5    Minutes  17      Prescription Details   Frequency (times per week)  2    Duration  Progress to 45 minutes of aerobic exercise without signs/symptoms of physical distress      Intensity   THRR 40-80% of Max Heartrate  58-116    Ratings of Perceived Exertion  11-13    Perceived Dyspnea  0-4      Progression   Progression  Continue progressive overload as per policy without signs/symptoms or physical distress.      Resistance Training   Training Prescription  Yes    Weight  orange bands    Reps  10-15       Perform Capillary Blood Glucose checks as needed.  Exercise Prescription Changes:   Exercise Comments:   Exercise Goals and Review: Exercise Goals    Row Name 02/16/17 1051             Exercise Goals   Increase Physical Activity  Yes       Intervention  Provide advice, education, support and counseling about physical activity/exercise needs.;Develop an individualized exercise  prescription for aerobic and resistive training based on initial evaluation findings, risk stratification, comorbidities and participant's personal goals.       Expected Outcomes  Short Term: Attend rehab on a regular basis to increase amount of physical activity.       Increase Strength and Stamina  Yes       Intervention  Provide advice, education, support and counseling about physical activity/exercise needs.;Develop an individualized exercise prescription for aerobic and resistive training based on initial evaluation findings, risk stratification, comorbidities and participant's personal goals.       Expected Outcomes  Short Term: Increase workloads from initial exercise prescription for resistance, speed, and METs.;Short Term: Perform resistance training exercises routinely  during rehab and add in resistance training at home;Long Term: Improve cardiorespiratory fitness, muscular endurance and strength as measured by increased METs and functional capacity (6MWT)       Able to understand and use rate of perceived exertion (RPE) scale  Yes       Intervention  Provide education and explanation on how to use RPE scale       Expected Outcomes  Short Term: Able to use RPE daily in rehab to express subjective intensity level;Long Term:  Able to use RPE to guide intensity level when exercising independently       Able to understand and use Dyspnea scale  Yes       Intervention  Provide education and explanation on how to use Dyspnea scale       Expected Outcomes  Short Term: Able to use Dyspnea scale daily in rehab to express subjective sense of shortness of breath during exertion;Long Term: Able to use Dyspnea scale to guide intensity level when exercising independently       Knowledge and understanding of Target Heart Rate Range (THRR)  Yes       Intervention  Provide education and explanation of THRR including how the numbers were predicted and where they are located for reference       Expected Outcomes   Short Term: Able to state/look up THRR;Short Term: Able to use daily as guideline for intensity in rehab;Long Term: Able to use THRR to govern intensity when exercising independently       Understanding of Exercise Prescription  Yes       Intervention  Provide education, explanation, and written materials on patient's individual exercise prescription       Expected Outcomes  Short Term: Able to explain program exercise prescription;Long Term: Able to explain home exercise prescription to exercise independently          Exercise Goals Re-Evaluation :   Discharge Exercise Prescription (Final Exercise Prescription Changes):   Nutrition:  Target Goals: Understanding of nutrition guidelines, daily intake of sodium 1500mg , cholesterol 200mg , calories 30% from fat and 7% or less from saturated fats, daily to have 5 or more servings of fruits and vegetables.  Biometrics: Pre Biometrics - 02/16/17 1115      Pre Biometrics   Grip Strength  23 kg        Nutrition Therapy Plan and Nutrition Goals:   Nutrition Assessments:   Nutrition Goals Re-Evaluation:   Nutrition Goals Discharge (Final Nutrition Goals Re-Evaluation):   Psychosocial: Target Goals: Acknowledge presence or absence of significant depression and/or stress, maximize coping skills, provide positive support system. Participant is able to verbalize types and ability to use techniques and skills needed for reducing stress and depression.  Initial Review & Psychosocial Screening: Initial Psych Review & Screening - 02/16/17 1109      Initial Review   Current issues with  -- Dementia      Family Dynamics   Good Support System?  Yes      Barriers   Psychosocial barriers to participate in program  There are no identifiable barriers or psychosocial needs.;The patient should benefit from training in stress management and relaxation.      Screening Interventions   Interventions  Encouraged to exercise       Quality  of Life Scores:  Scores of 19 and below usually indicate a poorer quality of life in these areas.  A difference of  2-3 points is a clinically meaningful difference.  A difference  of 2-3 points in the total score of the Quality of Life Index has been associated with significant improvement in overall quality of life, self-image, physical symptoms, and general health in studies assessing change in quality of life.   PHQ-9: Recent Review Flowsheet Data    Depression screen [facility] 2/9 02/16/2017 02/21/2016 08/26/2015 03/23/2015 08/20/2014   Decreased Interest 0 0 0 0 0   Down, Depressed, Hopeless 0 0 0 0 0   PHQ - 2 Score 0 0 0 0 0     Interpretation of Total Score  Total Score Depression Severity:  1-4 = Minimal depression, 5-9 = Mild depression, 10-14 = Moderate depression, 15-19 = Moderately severe depression, 20-27 = Severe depression   Psychosocial Evaluation and Intervention: Psychosocial Evaluation - 02/16/17 1110      Psychosocial Evaluation & Interventions   Interventions  Encouraged to exercise with the program and follow exercise prescription    Expected Outcomes  patient will remain free from psychosocial barriers to participation in pulmonary rehab    Continue Psychosocial Services   No Follow up required       Psychosocial Re-Evaluation:   Psychosocial Discharge (Final Psychosocial Re-Evaluation):   Education: Education Goals: Education classes will be provided on a weekly basis, covering required topics. Participant will state understanding/return demonstration of topics presented.  Learning Barriers/Preferences: Learning Barriers/Preferences - 02/16/17 1101      Learning Barriers/Preferences   Learning Barriers  Inability to learn new things    Learning Preferences  Individual Instruction       Education Topics: Risk Factor Reduction:  -Group instruction that is supported by a PowerPoint presentation. Instructor discusses the definition of a risk factor, different  risk factors for pulmonary disease, and how the heart and lungs work together.     Nutrition for Pulmonary Patient:  -Group instruction provided by PowerPoint slides, verbal discussion, and written materials to support subject matter. The instructor gives an explanation and review of healthy diet recommendations, which includes a discussion on weight management, recommendations for fruit and vegetable consumption, as well as protein, fluid, caffeine, fiber, sodium, sugar, and alcohol. Tips for eating when patients are short of breath are discussed.   Pursed Lip Breathing:  -Group instruction that is supported by demonstration and informational handouts. Instructor discusses the benefits of pursed lip and diaphragmatic breathing and detailed demonstration on how to preform both.     Oxygen Safety:  -Group instruction provided by PowerPoint, verbal discussion, and written material to support subject matter. There is an overview of "What is Oxygen" and "Why do we need it".  Instructor also reviews how to create a safe environment for oxygen use, the importance of using oxygen as prescribed, and the risks of noncompliance. There is a brief discussion on traveling with oxygen and resources the patient may utilize.   Oxygen Equipment:  -Group instruction provided by [facility] Staff utilizing handouts, written materials, and equipment demonstrations.   Signs and Symptoms:  -Group instruction provided by written material and verbal discussion to support subject matter. Warning signs and symptoms of infection, stroke, and heart attack are reviewed and when to call the physician/911 reinforced. Tips for preventing the spread of infection discussed.   Advanced Directives:  -Group instruction provided by verbal instruction and written material to support subject matter. Instructor reviews Advanced Directive laws and proper instruction for filling out document.   Pulmonary Video:  -Group video  education that reviews the importance of medication and oxygen compliance, exercise, good nutrition, pulmonary hygiene,  and pursed lip and diaphragmatic breathing for the pulmonary patient.   Exercise for the Pulmonary Patient:  -Group instruction that is supported by a PowerPoint presentation. Instructor discusses benefits of exercise, core components of exercise, frequency, duration, and intensity of an exercise routine, importance of utilizing pulse oximetry during exercise, safety while exercising, and options of places to exercise outside of rehab.     Pulmonary Medications:  -Verbally interactive group education provided by instructor with focus on inhaled medications and proper administration.   Anatomy and Physiology of the Respiratory System and Intimacy:  -Group instruction provided by PowerPoint, verbal discussion, and written material to support subject matter. Instructor reviews respiratory cycle and anatomical components of the respiratory system and their functions. Instructor also reviews differences in obstructive and restrictive respiratory diseases with examples of each. Intimacy, Sex, and Sexuality differences are reviewed with a discussion on how relationships can change when diagnosed with pulmonary disease. Common sexual concerns are reviewed.   MD DAY -A group question and answer session with a medical doctor that allows participants to ask questions that relate to their pulmonary disease state.   OTHER EDUCATION -Group or individual verbal, written, or video instructions that support the educational goals of the pulmonary rehab program.   Holiday Eating Survival Tips:  -Group instruction provided by PowerPoint slides, verbal discussion, and written materials to support subject matter. The instructor gives patients tips, tricks, and techniques to help them not only survive but enjoy the holidays despite the onslaught of food that accompanies the  holidays.   Knowledge Questionnaire Score:   Core Components/Risk Factors/Patient Goals at Admission: Personal Goals and Risk Factors at Admission - 02/16/17 1109      Core Components/Risk Factors/Patient Goals on Admission   Improve shortness of breath with ADL's  Yes    Intervention  Provide education, individualized exercise plan and daily activity instruction to help decrease symptoms of SOB with activities of daily living.    Expected Outcomes  Short Term: Improve cardiorespiratory fitness to achieve a reduction of symptoms when performing ADLs;Long Term: Be able to perform more ADLs without symptoms or delay the onset of symptoms       Core Components/Risk Factors/Patient Goals Review:    Core Components/Risk Factors/Patient Goals at Discharge (Final Review):    ITP Comments:   Comments:

## 2017-02-27 ENCOUNTER — Telehealth (HOSPITAL_COMMUNITY): Payer: Self-pay | Admitting: Nurse Practitioner

## 2017-02-27 ENCOUNTER — Encounter (HOSPITAL_COMMUNITY): Payer: Medicare Other

## 2017-03-01 ENCOUNTER — Encounter (HOSPITAL_COMMUNITY)
Admission: RE | Admit: 2017-03-01 | Discharge: 2017-03-01 | Disposition: A | Discharge status: Home / Self Care | Payer: Medicare Other | Source: Ambulatory Visit | Attending: Pulmonary Disease | Admitting: Pulmonary Disease

## 2017-03-01 DIAGNOSIS — J449 Chronic obstructive pulmonary disease, unspecified: Secondary | ICD-10-CM | POA: Insufficient documentation

## 2017-03-01 NOTE — Progress Notes (Signed)
Daily Session Note  Patient Details  Name: Carol Monroe MRN: [id number] Date of Birth: 02/19/1940 Referring Provider:     Pulmonary Rehab Walk Test from 02/20/2017 in [place]  Referring Provider  Dr. Vaughan Browner      Encounter Date: 03/01/2017  Check In: Session Check In - 03/01/17 1330      Check-In   Location  [facility]    Staff Present  Rosebud Poles, RN, BSN;Molly diVincenzo, MS, ACSM RCEP, Exercise Physiologist;Lisa Ysidro Evert, RN;Portia Rollene Rotunda, RN, BSN    Supervising physician immediately available to respond to emergencies  [facility] immediately available    Physician(s)  Dr. Denton Brick    Medication changes reported      No    Fall or balance concerns reported     No    Tobacco Cessation  No Change    Warm-up and Cool-down  Performed as group-led instruction    Resistance Training Performed  Yes    VAD Patient?  No      Pain Assessment   Currently in Pain?  No/denies    Multiple Pain Sites  No       Capillary Blood Glucose: No results found for this or any previous visit (from the past 24 hour(s)).    Social History   Tobacco Use  Smoking Status Former Smoker  . Packs/day: 2.00  . Years: 40.00  . Pack years: 80.00  . Types: Cigarettes  . Last attempt to quit: 01/24/2006  . Years since quitting: 11.1  Smokeless Tobacco Never Used    Goals Met:  Exercise tolerated well Strength training completed today  Goals Unmet:  Not Applicable  Comments: Service time is from 1330 to 1515.    Dr. Rush Farmer is Medical Director for Pulmonary Rehab at [facility].

## 2017-03-06 ENCOUNTER — Encounter (HOSPITAL_COMMUNITY)
Admission: RE | Admit: 2017-03-06 | Discharge: 2017-03-06 | Disposition: A | Discharge status: Home / Self Care | Payer: Medicare Other | Source: Ambulatory Visit | Attending: Pulmonary Disease | Admitting: Pulmonary Disease

## 2017-03-06 DIAGNOSIS — J449 Chronic obstructive pulmonary disease, unspecified: Secondary | ICD-10-CM | POA: Diagnosis not present

## 2017-03-06 NOTE — Progress Notes (Signed)
Daily Session Note  Patient Details  Name: Carol Monroe MRN: [id number] Date of Birth: Jul 09, 1940 Referring Provider:     Pulmonary Rehab Walk Test from 02/20/2017 in [place]  Referring Provider  Dr. Vaughan Browner      Encounter Date: 03/06/2017  Check In: Session Check In - 03/06/17 1326      Check-In   Location  [facility]    Staff Present  Rosebud Poles, RN, BSN;Molly diVincenzo, MS, ACSM RCEP, Exercise Physiologist;Lisa Ysidro Evert, RN;Kilyn Maragh Rollene Rotunda, RN, BSN    Supervising physician immediately available to respond to emergencies  [facility] immediately available    Physician(s)  Dr. Lonny Prude    Medication changes reported      No    Fall or balance concerns reported     No    Tobacco Cessation  No Change    Warm-up and Cool-down  Performed as group-led instruction    Resistance Training Performed  Yes    VAD Patient?  No      Pain Assessment   Currently in Pain?  No/denies    Multiple Pain Sites  No       Capillary Blood Glucose: No results found for this or any previous visit (from the past 24 hour(s)).    Social History   Tobacco Use  Smoking Status Former Smoker  . Packs/day: 2.00  . Years: 40.00  . Pack years: 80.00  . Types: Cigarettes  . Last attempt to quit: 01/24/2006  . Years since quitting: 11.1  Smokeless Tobacco Never Used    Goals Met:  Exercise tolerated well Queuing for purse lip breathing No report of cardiac concerns or symptoms Strength training completed today  Goals Unmet:  Not Applicable  Comments: Service time is from 1330 to 1510   Dr. Rush Farmer is Medical Director for Pulmonary Rehab at [facility].

## 2017-03-06 NOTE — Progress Notes (Signed)
Pulmonary Individual Treatment Plan  Patient Details  Name: Carol Monroe MRN: [id number] Date of Birth: [birth year] Referring Provider:     Pulmonary Rehab Walk Test from 02/20/2017 in [facility] [place]  Referring Provider  Dr. Vaughan Browner      Initial Encounter Date:    Pulmonary Rehab Walk Test from 02/20/2017 in [facility]  Date  02/13/17  Referring Provider  Dr. Vaughan Browner      Visit Diagnosis: Chronic obstructive pulmonary disease, unspecified COPD type ([place])  Patient's Home Medications on Admission:   Current Outpatient Medications:  .  albuterol (PROVENTIL HFA;VENTOLIN HFA) 108 (90 Base) MCG/ACT inhaler, Inhale 2 puffs into the lungs every 6 (six) hours as needed for wheezing or shortness of breath., Disp: 1 Inhaler, Rfl: 2 .  atorvastatin (LIPITOR) 10 MG tablet, Take 1 tablet (10 mg total) every evening by mouth., Disp: 90 tablet, Rfl: 1 .  carvedilol (COREG) 25 MG tablet, TAKE 1 TABLET (25 MG TOTAL) BY MOUTH 2 (TWO) TIMES DAILY WITH A MEAL., Disp: 180 tablet, Rfl: 1 .  chlorthalidone (HYGROTON) 25 MG tablet, Take one tablet by mouth once every morning, Disp: 90 tablet, Rfl: 3 .  CREON 24000-76000 units CPEP, TAKE ONE CAPSULE BY MOUTH 3 TIMES A DAY WITH MEALS, Disp: 90 capsule, Rfl: 1 .  diclofenac sodium (VOLTAREN) 1 % GEL, Apply 2 g topically daily., Disp: , Rfl:  .  KLOR-CON M20 20 MEQ tablet, TAKE 1 TABLET EVERY DAY ON MON,WED,FRI AND SAT, Disp: 48 tablet, Rfl: 1 .  lisinopril (PRINIVIL,ZESTRIL) 40 MG tablet, TAKE 1 TABLET BY MOUTH EVERY DAY, Disp: 30 tablet, Rfl: 3 .  NAMZARIC 28-10 MG CP24, USE AS DIRECTED 1 CAPSULE IN THE MOUTH OR THROAT DAILY., Disp: 90 capsule, Rfl: 1 .  Tiotropium Bromide-Olodaterol (STIOLTO RESPIMAT) 2.5-2.5 MCG/ACT AERS, Inhale 2 puffs into the lungs daily., Disp: 1 Inhaler, Rfl: 5  Past Medical History: Past Medical History:  Diagnosis Date  . CHF (congestive heart failure) ([place])   . COPD (chronic  obstructive pulmonary disease) ([place])   . Dementia   . Heart attack ([place])   . History of falling   . Hypertension   . [place], heart   . Stroke [facility])     Tobacco Use: Social History   Tobacco Use  Smoking Status Former Smoker  . Packs/day: 2.00  . Years: 40.00  . Pack years: 80.00  . Types: Cigarettes  . Last attempt to quit: 01/24/2006  . Years since quitting: 11.1  Smokeless Tobacco Never Used    Labs: Recent Chemical engineer    Labs for ITP Cardiac and Pulmonary Rehab Latest Ref Rng & Units 10/16/2012 07/10/2013 08/20/2014 08/26/2015 06/29/2016   Cholestrol <200 mg/dL 161 158 162 162 171   LDLCALC <100 mg/dL 70 57 62 65 75   HDL >50 mg/dL 75 90 85 80 79   Trlycerides <150 mg/dL 80 53 73 86 87      Capillary Blood Glucose: No results found for: GLUCAP   Pulmonary Assessment Scores: Pulmonary Assessment Scores    Row Name 02/20/17 1617         ADL UCSD   ADL Phase  Entry       mMRC Score   mMRC Score  3        Pulmonary Function Assessment: Pulmonary Function Assessment - 02/16/17 1109      Breath   Bilateral Breath Sounds  Clear    Shortness of Breath  Yes;Limiting  activity       Exercise Target Goals:    Exercise Program Goal: Individual exercise prescription set using results from initial 6 min walk test and THRR while considering  patient's activity barriers and safety.    Exercise Prescription Goal: Initial exercise prescription builds to 30-45 minutes a day of aerobic activity, 2-3 days per week.  Home exercise guidelines will be given to patient during program as part of exercise prescription that the participant will acknowledge.  Activity Barriers & Risk Stratification: Activity Barriers & Cardiac Risk Stratification - 02/16/17 1049      Activity Barriers & Cardiac Risk Stratification   Activity Barriers  Deconditioning;Shortness of Breath;History of Falls       6 Minute Walk: 6 Minute Walk    Row Name 02/20/17 1611         6  Minute Walk   Phase  Initial     Distance  700 feet     Walk Time  6 minutes     # of Rest Breaks  0     MPH  1.32     METS  2     RPE  13     Perceived Dyspnea   1     Symptoms  Yes (comment)     Comments  leg fatigue, used wheelchair, not a reliable pulse ox reading     Resting HR  64 bpm     Resting BP  124/82     Resting Oxygen Saturation   95 %     Exercise Oxygen Saturation  during 6 min walk  91 %     Max Ex. HR  99 bpm     Max Ex. BP  146/77     2 Minute Post BP  146/86       Interval HR   1 Minute HR  92     2 Minute HR  94     4 Minute HR  97     5 Minute HR  99     6 Minute HR  96     2 Minute Post HR  67     Interval Heart Rate?  Yes       Interval Oxygen   Interval Oxygen?  Yes     Baseline Oxygen Saturation %  995 %     1 Minute Oxygen Saturation %  85 %     1 Minute Liters of Oxygen  2 L     2 Minute Oxygen Saturation %  91 %     2 Minute Liters of Oxygen  3 L     3 Minute Oxygen Saturation %  3 %     3 Minute Liters of Oxygen  3 L     4 Minute Oxygen Saturation %  94 %     4 Minute Liters of Oxygen  3 L     5 Minute Oxygen Saturation %  97 %     5 Minute Liters of Oxygen  3 L     6 Minute Oxygen Saturation %  95 %     6 Minute Liters of Oxygen  3 L     2 Minute Post Oxygen Saturation %  98 %     2 Minute Post Liters of Oxygen  3 L        Oxygen Initial Assessment: Oxygen Initial Assessment - 02/20/17 1616      Initial 6 min Walk   Oxygen Used  Continuous;E-Tanks    Liters per minute  2 2-3      Program Oxygen Prescription   Program Oxygen Prescription  Continuous;E-Tanks    Liters per minute  -- 2-3      Intervention   Short Term Goals  To learn and exhibit compliance with exercise, home and travel O2 prescription;To learn and understand importance of monitoring SPO2 with pulse oximeter and demonstrate accurate use of the pulse oximeter.;To learn and understand importance of maintaining oxygen saturations>88%;To learn and demonstrate proper  pursed lip breathing techniques or other breathing techniques.;To learn and demonstrate proper use of respiratory medications    Long  Term Goals  Exhibits compliance with exercise, home and travel O2 prescription;Verbalizes importance of monitoring SPO2 with pulse oximeter and return demonstration;Maintenance of O2 saturations>88%;Exhibits proper breathing techniques, such as pursed lip breathing or other method taught during program session;Compliance with respiratory medication;Demonstrates proper use of MDI's       Oxygen Re-Evaluation: Oxygen Re-Evaluation    Row Name 03/02/17 1019             Program Oxygen Prescription   Program Oxygen Prescription  Continuous;E-Tanks       Liters per minute  2         Home Oxygen   Home Oxygen Device  Home Concentrator;E-Tanks       Sleep Oxygen Prescription  Continuous       Liters per minute  2       Home Exercise Oxygen Prescription  Continuous       Liters per minute  2       Home at Rest Exercise Oxygen Prescription  None       Compliance with Home Oxygen Use  Yes         Goals/Expected Outcomes   Short Term Goals  To learn and exhibit compliance with exercise, home and travel O2 prescription;To learn and understand importance of monitoring SPO2 with pulse oximeter and demonstrate accurate use of the pulse oximeter.;To learn and understand importance of maintaining oxygen saturations>88%;To learn and demonstrate proper pursed lip breathing techniques or other breathing techniques.;To learn and demonstrate proper use of respiratory medications       Long  Term Goals  Exhibits compliance with exercise, home and travel O2 prescription;Verbalizes importance of monitoring SPO2 with pulse oximeter and return demonstration;Maintenance of O2 saturations>88%;Exhibits proper breathing techniques, such as pursed lip breathing or other method taught during program session;Compliance with respiratory medication;Demonstrates proper use of MDI's           Oxygen Discharge (Final Oxygen Re-Evaluation): Oxygen Re-Evaluation - 03/02/17 1019      Program Oxygen Prescription   Program Oxygen Prescription  Continuous;E-Tanks    Liters per minute  2      Home Oxygen   Home Oxygen Device  Home Concentrator;E-Tanks    Sleep Oxygen Prescription  Continuous    Liters per minute  2    Home Exercise Oxygen Prescription  Continuous    Liters per minute  2    Home at Rest Exercise Oxygen Prescription  None    Compliance with Home Oxygen Use  Yes      Goals/Expected Outcomes   Short Term Goals  To learn and exhibit compliance with exercise, home and travel O2 prescription;To learn and understand importance of monitoring SPO2 with pulse oximeter and demonstrate accurate use of the pulse oximeter.;To learn and understand importance of maintaining oxygen saturations>88%;To learn and demonstrate proper pursed lip breathing techniques or other breathing  techniques.;To learn and demonstrate proper use of respiratory medications    Long  Term Goals  Exhibits compliance with exercise, home and travel O2 prescription;Verbalizes importance of monitoring SPO2 with pulse oximeter and return demonstration;Maintenance of O2 saturations>88%;Exhibits proper breathing techniques, such as pursed lip breathing or other method taught during program session;Compliance with respiratory medication;Demonstrates proper use of MDI's       Initial Exercise Prescription: Initial Exercise Prescription - 02/20/17 1600      Date of Initial Exercise RX and Referring Provider   Date  02/13/17    Referring Provider  Dr. Vaughan Browner      Oxygen   Oxygen  Continuous    Liters  -- 2-3      Recumbant Bike   Level  1    Minutes  17      NuStep   Level  2    SPM  80    Minutes  17    METs  1.5      Track   Laps  5    Minutes  17      Prescription Details   Frequency (times per week)  2    Duration  Progress to 45 minutes of aerobic exercise without signs/symptoms of  physical distress      Intensity   THRR 40-80% of Max Heartrate  58-116    Ratings of Perceived Exertion  11-13    Perceived Dyspnea  0-4      Progression   Progression  Continue progressive overload as per policy without signs/symptoms or physical distress.      Resistance Training   Training Prescription  Yes    Weight  orange bands    Reps  10-15       Perform Capillary Blood Glucose checks as needed.  Exercise Prescription Changes:   Exercise Comments:   Exercise Goals and Review: Exercise Goals    Row Name 02/16/17 1051             Exercise Goals   Increase Physical Activity  Yes       Intervention  Provide advice, education, support and counseling about physical activity/exercise needs.;Develop an individualized exercise prescription for aerobic and resistive training based on initial evaluation findings, risk stratification, comorbidities and participant's personal goals.       Expected Outcomes  Short Term: Attend rehab on a regular basis to increase amount of physical activity.       Increase Strength and Stamina  Yes       Intervention  Provide advice, education, support and counseling about physical activity/exercise needs.;Develop an individualized exercise prescription for aerobic and resistive training based on initial evaluation findings, risk stratification, comorbidities and participant's personal goals.       Expected Outcomes  Short Term: Increase workloads from initial exercise prescription for resistance, speed, and METs.;Short Term: Perform resistance training exercises routinely during rehab and add in resistance training at home;Long Term: Improve cardiorespiratory fitness, muscular endurance and strength as measured by increased METs and functional capacity (6MWT)       Able to understand and use rate of perceived exertion (RPE) scale  Yes       Intervention  Provide education and explanation on how to use RPE scale       Expected Outcomes  Short  Term: Able to use RPE daily in rehab to express subjective intensity level;Long Term:  Able to use RPE to guide intensity level when exercising independently       Able  to understand and use Dyspnea scale  Yes       Intervention  Provide education and explanation on how to use Dyspnea scale       Expected Outcomes  Short Term: Able to use Dyspnea scale daily in rehab to express subjective sense of shortness of breath during exertion;Long Term: Able to use Dyspnea scale to guide intensity level when exercising independently       Knowledge and understanding of Target Heart Rate Range (THRR)  Yes       Intervention  Provide education and explanation of THRR including how the numbers were predicted and where they are located for reference       Expected Outcomes  Short Term: Able to state/look up THRR;Short Term: Able to use daily as guideline for intensity in rehab;Long Term: Able to use THRR to govern intensity when exercising independently       Understanding of Exercise Prescription  Yes       Intervention  Provide education, explanation, and written materials on patient's individual exercise prescription       Expected Outcomes  Short Term: Able to explain program exercise prescription;Long Term: Able to explain home exercise prescription to exercise independently          Exercise Goals Re-Evaluation : Exercise Goals Re-Evaluation    Row Name 03/02/17 1020             Exercise Goal Re-Evaluation   Exercise Goals Review  Increase Physical Activity;Able to understand and use rate of perceived exertion (RPE) scale;Knowledge and understanding of Target Heart Rate Range (THRR);Understanding of Exercise Prescription;Increase Strength and Stamina;Able to understand and use Dyspnea scale       Comments  Patient has only attended one rehab session. Will cont. to monitor and motivate.        Expected Outcomes  Through exercise at rehab and at home, patient will increase strength and stamina making  ADL's easier to perform. Patient will also have a better understanding of safe exercise and what they are capable to do outside of clinical supervision.          Discharge Exercise Prescription (Final Exercise Prescription Changes):   Nutrition:  Target Goals: Understanding of nutrition guidelines, daily intake of sodium 1500mg , cholesterol 200mg , calories 30% from fat and 7% or less from saturated fats, daily to have 5 or more servings of fruits and vegetables.  Biometrics: Pre Biometrics - 02/16/17 1115      Pre Biometrics   Grip Strength  23 kg        Nutrition Therapy Plan and Nutrition Goals:   Nutrition Assessments:   Nutrition Goals Re-Evaluation:   Nutrition Goals Discharge (Final Nutrition Goals Re-Evaluation):   Psychosocial: Target Goals: Acknowledge presence or absence of significant depression and/or stress, maximize coping skills, provide positive support system. Participant is able to verbalize types and ability to use techniques and skills needed for reducing stress and depression.  Initial Review & Psychosocial Screening: Initial Psych Review & Screening - 02/16/17 1109      Initial Review   Current issues with  -- Dementia      Family Dynamics   Good Support System?  Yes      Barriers   Psychosocial barriers to participate in program  There are no identifiable barriers or psychosocial needs.;The patient should benefit from training in stress management and relaxation.      Screening Interventions   Interventions  Encouraged to exercise       Quality  of Life Scores:  Scores of 19 and below usually indicate a poorer quality of life in these areas.  A difference of  2-3 points is a clinically meaningful difference.  A difference of 2-3 points in the total score of the Quality of Life Index has been associated with significant improvement in overall quality of life, self-image, physical symptoms, and general health in studies assessing change in  quality of life.   PHQ-9: Recent Review Flowsheet Data    Depression screen [facility] 2/9 02/16/2017 02/21/2016 08/26/2015 03/23/2015 08/20/2014   Decreased Interest 0 0 0 0 0   Down, Depressed, Hopeless 0 0 0 0 0   PHQ - 2 Score 0 0 0 0 0     Interpretation of Total Score  Total Score Depression Severity:  1-4 = Minimal depression, 5-9 = Mild depression, 10-14 = Moderate depression, 15-19 = Moderately severe depression, 20-27 = Severe depression   Psychosocial Evaluation and Intervention: Psychosocial Evaluation - 02/16/17 1110      Psychosocial Evaluation & Interventions   Interventions  Encouraged to exercise with the program and follow exercise prescription    Expected Outcomes  patient will remain free from psychosocial barriers to participation in pulmonary rehab    Continue Psychosocial Services   No Follow up required       Psychosocial Re-Evaluation: Psychosocial Re-Evaluation    [place] Name 03/06/17 1023             Psychosocial Re-Evaluation   Current issues with  None Identified       Expected Outcomes  no barriers to participation in pulmonary rehab       Interventions  Encouraged to attend Pulmonary Rehabilitation for the exercise       Continue Psychosocial Services   No Follow up required          Psychosocial Discharge (Final Psychosocial Re-Evaluation): Psychosocial Re-Evaluation - 03/06/17 1023      Psychosocial Re-Evaluation   Current issues with  None Identified    Expected Outcomes  no barriers to participation in pulmonary rehab    Interventions  Encouraged to attend Pulmonary Rehabilitation for the exercise    Continue Psychosocial Services   No Follow up required       Education: Education Goals: Education classes will be provided on a weekly basis, covering required topics. Participant will state understanding/return demonstration of topics presented.  Learning Barriers/Preferences: Learning Barriers/Preferences - 02/16/17 1101      Learning  Barriers/Preferences   Learning Barriers  Inability to learn new things    Learning Preferences  Individual Instruction       Education Topics: Risk Factor Reduction:  -Group instruction that is supported by a PowerPoint presentation. Instructor discusses the definition of a risk factor, different risk factors for pulmonary disease, and how the heart and lungs work together.     Nutrition for Pulmonary Patient:  -Group instruction provided by PowerPoint slides, verbal discussion, and written materials to support subject matter. The instructor gives an explanation and review of healthy diet recommendations, which includes a discussion on weight management, recommendations for fruit and vegetable consumption, as well as protein, fluid, caffeine, fiber, sodium, sugar, and alcohol. Tips for eating when patients are short of breath are discussed.   Pursed Lip Breathing:  -Group instruction that is supported by demonstration and informational handouts. Instructor discusses the benefits of pursed lip and diaphragmatic breathing and detailed demonstration on how to preform both.     Oxygen Safety:  -Group instruction provided by  PowerPoint, verbal discussion, and written material to support subject matter. There is an overview of "What is Oxygen" and "Why do we need it".  Instructor also reviews how to create a safe environment for oxygen use, the importance of using oxygen as prescribed, and the risks of noncompliance. There is a brief discussion on traveling with oxygen and resources the patient may utilize.   PULMONARY REHAB CHRONIC OBSTRUCTIVE PULMONARY DISEASE from 03/01/2017 in [place]  Date  03/01/17  Educator  Truddie Crumble  Instruction Review Code  2- Demonstrated Understanding      Oxygen Equipment:  -Group instruction provided by [facility] Staff utilizing handouts, written materials, and equipment demonstrations.   Signs and Symptoms:  -Group  instruction provided by written material and verbal discussion to support subject matter. Warning signs and symptoms of infection, stroke, and heart attack are reviewed and when to call the physician/911 reinforced. Tips for preventing the spread of infection discussed.   Advanced Directives:  -Group instruction provided by verbal instruction and written material to support subject matter. Instructor reviews Advanced Directive laws and proper instruction for filling out document.   Pulmonary Video:  -Group video education that reviews the importance of medication and oxygen compliance, exercise, good nutrition, pulmonary hygiene, and pursed lip and diaphragmatic breathing for the pulmonary patient.   Exercise for the Pulmonary Patient:  -Group instruction that is supported by a PowerPoint presentation. Instructor discusses benefits of exercise, core components of exercise, frequency, duration, and intensity of an exercise routine, importance of utilizing pulse oximetry during exercise, safety while exercising, and options of places to exercise outside of rehab.     Pulmonary Medications:  -Verbally interactive group education provided by instructor with focus on inhaled medications and proper administration.   Anatomy and Physiology of the Respiratory System and Intimacy:  -Group instruction provided by PowerPoint, verbal discussion, and written material to support subject matter. Instructor reviews respiratory cycle and anatomical components of the respiratory system and their functions. Instructor also reviews differences in obstructive and restrictive respiratory diseases with examples of each. Intimacy, Sex, and Sexuality differences are reviewed with a discussion on how relationships can change when diagnosed with pulmonary disease. Common sexual concerns are reviewed.   MD DAY -A group question and answer session with a medical doctor that allows participants to ask questions that  relate to their pulmonary disease state.   OTHER EDUCATION -Group or individual verbal, written, or video instructions that support the educational goals of the pulmonary rehab program.   Holiday Eating Survival Tips:  -Group instruction provided by PowerPoint slides, verbal discussion, and written materials to support subject matter. The instructor gives patients tips, tricks, and techniques to help them not only survive but enjoy the holidays despite the onslaught of food that accompanies the holidays.   Knowledge Questionnaire Score:   Core Components/Risk Factors/Patient Goals at Admission: Personal Goals and Risk Factors at Admission - 02/16/17 1109      Core Components/Risk Factors/Patient Goals on Admission   Improve shortness of breath with ADL's  Yes    Intervention  Provide education, individualized exercise plan and daily activity instruction to help decrease symptoms of SOB with activities of daily living.    Expected Outcomes  Short Term: Improve cardiorespiratory fitness to achieve a reduction of symptoms when performing ADLs;Long Term: Be able to perform more ADLs without symptoms or delay the onset of symptoms       Core Components/Risk Factors/Patient Goals Review:  Goals and Risk  Factor Review    Row Name 03/06/17 1022             Core Components/Risk Factors/Patient Goals Review   Personal Goals Review  Improve shortness of breath with ADL's       Review  Just started program, too early to see improvement       Expected Outcomes  see admission goals          Core Components/Risk Factors/Patient Goals at Discharge (Final Review):  Goals and Risk Factor Review - 03/06/17 1022      Core Components/Risk Factors/Patient Goals Review   Personal Goals Review  Improve shortness of breath with ADL's    Review  Just started program, too early to see improvement    Expected Outcomes  see admission goals       ITP Comments:   Comments: ITP REVIEW Pt is  making expected progress toward pulmonary rehab goals after completing 1 sessions. Recommend continued exercise, life style modification, education, and utilization of breathing techniques to increase stamina and strength and decrease shortness of breath with exertion.

## 2017-03-07 ENCOUNTER — Encounter (HOSPITAL_COMMUNITY): Payer: Self-pay | Admitting: *Deleted

## 2017-03-08 ENCOUNTER — Encounter (HOSPITAL_COMMUNITY)
Admission: RE | Admit: 2017-03-08 | Discharge: 2017-03-08 | Disposition: A | Discharge status: Home / Self Care | Payer: Medicare Other | Source: Ambulatory Visit | Attending: Pulmonary Disease | Admitting: Pulmonary Disease

## 2017-03-08 VITALS — Wt 147.9 lb

## 2017-03-08 DIAGNOSIS — J449 Chronic obstructive pulmonary disease, unspecified: Secondary | ICD-10-CM

## 2017-03-08 NOTE — Progress Notes (Signed)
Daily Session Note  Patient Details  Name: Carol Monroe MRN: [id number] Date of Birth: 1940/07/23 Referring Provider:     Pulmonary Rehab Walk Test from 02/20/2017 in [place]  Referring Provider  Dr. Vaughan Browner      Encounter Date: 03/08/2017  Check In: Session Check In - 03/08/17 1330      Check-In   Location  [facility]    Staff Present  Su Hilt, MS, ACSM RCEP, Exercise Physiologist;Kiowa Peifer Ysidro Evert, RN;Portia Rollene Rotunda, RN, BSN    Supervising physician immediately available to respond to emergencies  [facility] immediately available    Physician(s)  Dr. Tyrell Antonio    Medication changes reported      No    Fall or balance concerns reported     No    Tobacco Cessation  No Change    Warm-up and Cool-down  Performed as group-led instruction    Resistance Training Performed  Yes    VAD Patient?  No      Pain Assessment   Currently in Pain?  No/denies    Multiple Pain Sites  No       Capillary Blood Glucose: No results found for this or any previous visit (from the past 24 hour(s)).    Social History   Tobacco Use  Smoking Status Former Smoker  . Packs/day: 2.00  . Years: 40.00  . Pack years: 80.00  . Types: Cigarettes  . Last attempt to quit: 01/24/2006  . Years since quitting: 11.1  Smokeless Tobacco Never Used    Goals Met:  Exercise tolerated well No report of cardiac concerns or symptoms Strength training completed today  Goals Unmet:  Not Applicable  Comments: Service time is from 1330 to 1535    Dr. Rush Farmer is Medical Director for Pulmonary Rehab at [facility].

## 2017-03-13 ENCOUNTER — Encounter (HOSPITAL_COMMUNITY): Payer: Medicare Other

## 2017-03-15 ENCOUNTER — Encounter (HOSPITAL_COMMUNITY): Admission: RE | Admit: 2017-03-15 | Payer: Medicare Other | Source: Ambulatory Visit

## 2017-03-16 NOTE — Progress Notes (Signed)
Carol Monroe 77 y.o. female  DOB: 12/01/1940 MRN: [id number]           Nutrition Brief Note 1. Chronic obstructive pulmonary disease, unspecified COPD type ([place])    Past Medical History:  Diagnosis Date  . CHF (congestive heart failure) ([place])   . COPD (chronic obstructive pulmonary disease) ([place])   . Dementia   . Heart attack ([place])   . History of falling   . Hypertension   . Murmur, heart   . Stroke [facility])    Meds reviewed.   Ht: Ht Readings from Last 1 Encounters:  02/16/17 5\' 1"  (1.549 m)    Wt:  Wt Readings from Last 3 Encounters:  03/08/17 147 lb 14.9 oz (67.1 kg)  02/16/17 149 lb 0.5 oz (67.6 kg)  12/28/16 145 lb (65.8 kg)    BMI: 27.97    Current tobacco use? No  Labs:  Lipid Panel     Component Value Date/Time   CHOL 171 06/29/2016 0942   CHOL 162 08/20/2014 1425   TRIG 87 06/29/2016 0942   HDL 79 06/29/2016 0942   HDL 85 08/20/2014 1425   CHOLHDL 2.2 06/29/2016 0942   VLDL 17 06/29/2016 0942   LDLCALC 75 06/29/2016 0942   LDLCALC 62 08/20/2014 1425    No results found for: HGBA1C  Nutrition Diagnosis ? Food-and nutrition-related knowledge deficit related to lack of exposure to information as related to diagnosis of pulmonary disease  Goal(s) 1. Describe the benefit of including fruits, vegetables, whole grains, and low-fat dairy products in a healthy meal plan.  Plan:  Pt to attend Pulmonary Nutrition class Will provide client-centered nutrition education as part of interdisciplinary care.   Monitor and evaluate progress toward nutrition goal with team.  Monitor and Evaluate progress toward nutrition goal with team.   Derek Mound, M.Ed, RD, LDN, CDE 03/16/2017 12:08 PM

## 2017-03-20 ENCOUNTER — Encounter (HOSPITAL_COMMUNITY)
Admission: RE | Admit: 2017-03-20 | Discharge: 2017-03-20 | Disposition: A | Discharge status: Home / Self Care | Payer: Medicare Other | Source: Ambulatory Visit | Attending: Pulmonary Disease | Admitting: Pulmonary Disease

## 2017-03-20 DIAGNOSIS — J449 Chronic obstructive pulmonary disease, unspecified: Secondary | ICD-10-CM | POA: Diagnosis not present

## 2017-03-20 NOTE — Progress Notes (Signed)
Daily Session Note  Patient Details  Name: Carol Monroe MRN: [id number] Date of Birth: Jan 03, 1941 Referring Provider:     Pulmonary Rehab Walk Test from 02/20/2017 in [place]  Referring Provider  Dr. Vaughan Browner      Encounter Date: 03/20/2017  Check In: Session Check In - 03/20/17 1330      Check-In   Location  [facility]    Staff Present  Rosebud Poles, RN, BSN;Molly diVincenzo, MS, ACSM RCEP, Exercise Physiologist;Portia Rollene Rotunda, RN, Roque Cash, RN    Supervising physician immediately available to respond to emergencies  [facility] immediately available    Physician(s)  Dr. Horris Latino    Medication changes reported      No    Fall or balance concerns reported     No    Tobacco Cessation  No Change    Warm-up and Cool-down  Performed as group-led instruction    Resistance Training Performed  Yes    VAD Patient?  No      Pain Assessment   Currently in Pain?  No/denies    Multiple Pain Sites  No       Capillary Blood Glucose: No results found for this or any previous visit (from the past 24 hour(s)).    Social History   Tobacco Use  Smoking Status Former Smoker  . Packs/day: 2.00  . Years: 40.00  . Pack years: 80.00  . Types: Cigarettes  . Last attempt to quit: 01/24/2006  . Years since quitting: 11.1  Smokeless Tobacco Never Used    Goals Met:  Exercise tolerated well Strength training completed today  Goals Unmet:  Not Applicable  Comments: Service time is from 1330 to 1500.    Dr. Rush Farmer is Medical Director for Pulmonary Rehab at [facility].

## 2017-03-21 ENCOUNTER — Encounter: Payer: Self-pay | Admitting: Internal Medicine

## 2017-03-22 ENCOUNTER — Encounter (HOSPITAL_COMMUNITY)
Admission: RE | Admit: 2017-03-22 | Discharge: 2017-03-22 | Disposition: A | Discharge status: Home / Self Care | Payer: Medicare Other | Source: Ambulatory Visit | Attending: Pulmonary Disease | Admitting: Pulmonary Disease

## 2017-03-22 DIAGNOSIS — J449 Chronic obstructive pulmonary disease, unspecified: Secondary | ICD-10-CM

## 2017-03-22 NOTE — Progress Notes (Signed)
Daily Session Note  Patient Details  Name: Carol Monroe MRN: [id number] Date of Birth: 05-Dec-1940 Referring Provider:     Pulmonary Rehab Walk Test from 02/20/2017 in [place]  Referring Provider  Dr. Vaughan Browner      Encounter Date: 03/22/2017  Check In: Session Check In - 03/22/17 1358      Check-In   Location  [facility]    Staff Present  Rosebud Poles, RN, BSN;Molly diVincenzo, MS, ACSM RCEP, Exercise Physiologist;Jacquez Sheetz Rollene Rotunda, RN, BSN    Supervising physician immediately available to respond to emergencies  [facility] immediately available    Physician(s)  Dr. Eliseo Squires    Medication changes reported      No    Fall or balance concerns reported     No    Tobacco Cessation  No Change    Warm-up and Cool-down  Performed as group-led instruction    Resistance Training Performed  Yes    VAD Patient?  No      Pain Assessment   Currently in Pain?  No/denies    Multiple Pain Sites  No       Capillary Blood Glucose: No results found for this or any previous visit (from the past 24 hour(s)).    Social History   Tobacco Use  Smoking Status Former Smoker  . Packs/day: 2.00  . Years: 40.00  . Pack years: 80.00  . Types: Cigarettes  . Last attempt to quit: 01/24/2006  . Years since quitting: 11.1  Smokeless Tobacco Never Used    Goals Met:  Exercise tolerated well No report of cardiac concerns or symptoms Strength training completed today  Goals Unmet:  Not Applicable  Comments: Service time is from 1330 to 1530   Dr. Rush Farmer is Medical Director for Pulmonary Rehab at [facility].

## 2017-03-26 NOTE — Progress Notes (Signed)
Discharge Psychosocial  Patient is being discharged from pulmonary rehab after 5 exercise sessions due to her moving back to her old home, [place], [place].  She will be living with her daughter there and at this point has no psychosocial issues to be addressed.

## 2017-03-26 NOTE — Progress Notes (Signed)
Discharge Progress Report  Patient Details  Name: Carol Monroe MRN: [id number] Date of Birth: Jan 02, 1941 Referring Provider:     Pulmonary Rehab Walk Test from 02/20/2017 in [place]  Referring Provider  Dr. Vaughan Browner       Number of Visits: 5  Reason for Discharge:  Early Exit:  due to moving back to [place] to live with her daughter.  Smoking History:  Social History   Tobacco Use  Smoking Status Former Smoker  . Packs/day: 2.00  . Years: 40.00  . Pack years: 80.00  . Types: Cigarettes  . Last attempt to quit: 01/24/2006  . Years since quitting: 11.1  Smokeless Tobacco Never Used    Diagnosis:  Chronic obstructive pulmonary disease, unspecified COPD type ([place])  ADL UCSD: Pulmonary Assessment Scores    Row Name 02/20/17 1617 03/07/17 1153       ADL UCSD   ADL Phase  Entry  Entry    SOB Score total  -  71      CAT Score   CAT Score  -  71 Entry      mMRC Score   mMRC Score  3  -       Initial Exercise Prescription: Initial Exercise Prescription - 02/20/17 1600      Date of Initial Exercise RX and Referring Provider   Date  02/13/17    Referring Provider  Dr. Vaughan Browner      Oxygen   Oxygen  Continuous    Liters  -- 2-3      Recumbant Bike   Level  1    Minutes  17      NuStep   Level  2    SPM  80    Minutes  17    METs  1.5      Track   Laps  5    Minutes  17      Prescription Details   Frequency (times per week)  2    Duration  Progress to 45 minutes of aerobic exercise without signs/symptoms of physical distress      Intensity   THRR 40-80% of Max Heartrate  58-116    Ratings of Perceived Exertion  11-13    Perceived Dyspnea  0-4      Progression   Progression  Continue progressive overload as per policy without signs/symptoms or physical distress.      Resistance Training   Training Prescription  Yes    Weight  orange bands    Reps  10-15       Discharge Exercise Prescription (Final Exercise  Prescription Changes): Exercise Prescription Changes - 03/08/17 1617      Response to Exercise   Blood Pressure (Admit)  120/84    Blood Pressure (Exercise)  110/80    Blood Pressure (Exit)  134/80    Heart Rate (Admit)  86 bpm    Heart Rate (Exercise)  84 bpm    Heart Rate (Exit)  89 bpm    Oxygen Saturation (Admit)  97 %    Oxygen Saturation (Exercise)  95 %    Oxygen Saturation (Exit)  96 %    Rating of Perceived Exertion (Exercise)  13    Perceived Dyspnea (Exercise)  2    Duration  Progress to 45 minutes of aerobic exercise without signs/symptoms of physical distress    Intensity  Other (comment) 40-80% HRR      Progression   Progression  Continue to  progress workloads to maintain intensity without signs/symptoms of physical distress.      Resistance Training   Training Prescription  Yes    Weight  orange bands    Reps  10-15      Oxygen   Oxygen  Continuous    Liters  2-3      Recumbant Bike   Level  1    Minutes  17      NuStep   Level  2    SPM  80    Minutes  17       Functional Capacity: 6 Minute Walk    Row Name 02/20/17 1611         6 Minute Walk   Phase  Initial     Distance  700 feet     Walk Time  6 minutes     # of Rest Breaks  0     MPH  1.32     METS  2     RPE  13     Perceived Dyspnea   1     Symptoms  Yes (comment)     Comments  leg fatigue, used wheelchair, not a reliable pulse ox reading     Resting HR  64 bpm     Resting BP  124/82     Resting Oxygen Saturation   95 %     Exercise Oxygen Saturation  during 6 min walk  91 %     Max Ex. HR  99 bpm     Max Ex. BP  146/77     2 Minute Post BP  146/86       Interval HR   1 Minute HR  92     2 Minute HR  94     4 Minute HR  97     5 Minute HR  99     6 Minute HR  96     2 Minute Post HR  67     Interval Heart Rate?  Yes       Interval Oxygen   Interval Oxygen?  Yes     Baseline Oxygen Saturation %  995 %     1 Minute Oxygen Saturation %  85 %     1 Minute Liters of  Oxygen  2 L     2 Minute Oxygen Saturation %  91 %     2 Minute Liters of Oxygen  3 L     3 Minute Oxygen Saturation %  3 %     3 Minute Liters of Oxygen  3 L     4 Minute Oxygen Saturation %  94 %     4 Minute Liters of Oxygen  3 L     5 Minute Oxygen Saturation %  97 %     5 Minute Liters of Oxygen  3 L     6 Minute Oxygen Saturation %  95 %     6 Minute Liters of Oxygen  3 L     2 Minute Post Oxygen Saturation %  98 %     2 Minute Post Liters of Oxygen  3 L        Psychological, QOL, Others - Outcomes: PHQ 2/9: Depression screen [facility] 2/9 02/16/2017 02/21/2016 08/26/2015 03/23/2015 08/20/2014  Decreased Interest 0 0 0 0 0  Down, Depressed, Hopeless 0 0 0 0 0  PHQ - 2 Score 0 0 0 0 0  Quality of Life:   Personal Goals: Goals established at orientation with interventions provided to work toward goal. Personal Goals and Risk Factors at Admission - 02/16/17 1109      Core Components/Risk Factors/Patient Goals on Admission   Improve shortness of breath with ADL's  Yes    Intervention  Provide education, individualized exercise plan and daily activity instruction to help decrease symptoms of SOB with activities of daily living.    Expected Outcomes  Short Term: Improve cardiorespiratory fitness to achieve a reduction of symptoms when performing ADLs;Long Term: Be able to perform more ADLs without symptoms or delay the onset of symptoms        Personal Goals Discharge: Goals and Risk Factor Review    Row Name 03/06/17 1022             Core Components/Risk Factors/Patient Goals Review   Personal Goals Review  Improve shortness of breath with ADL's       Review  Just started program, too early to see improvement       Expected Outcomes  see admission goals          Exercise Goals and Review: Exercise Goals    Row Name 02/16/17 1051             Exercise Goals   Increase Physical Activity  Yes       Intervention  Provide advice, education, support and counseling about  physical activity/exercise needs.;Develop an individualized exercise prescription for aerobic and resistive training based on initial evaluation findings, risk stratification, comorbidities and participant's personal goals.       Expected Outcomes  Short Term: Attend rehab on a regular basis to increase amount of physical activity.       Increase Strength and Stamina  Yes       Intervention  Provide advice, education, support and counseling about physical activity/exercise needs.;Develop an individualized exercise prescription for aerobic and resistive training based on initial evaluation findings, risk stratification, comorbidities and participant's personal goals.       Expected Outcomes  Short Term: Increase workloads from initial exercise prescription for resistance, speed, and METs.;Short Term: Perform resistance training exercises routinely during rehab and add in resistance training at home;Long Term: Improve cardiorespiratory fitness, muscular endurance and strength as measured by increased METs and functional capacity (6MWT)       Able to understand and use rate of perceived exertion (RPE) scale  Yes       Intervention  Provide education and explanation on how to use RPE scale       Expected Outcomes  Short Term: Able to use RPE daily in rehab to express subjective intensity level;Long Term:  Able to use RPE to guide intensity level when exercising independently       Able to understand and use Dyspnea scale  Yes       Intervention  Provide education and explanation on how to use Dyspnea scale       Expected Outcomes  Short Term: Able to use Dyspnea scale daily in rehab to express subjective sense of shortness of breath during exertion;Long Term: Able to use Dyspnea scale to guide intensity level when exercising independently       Knowledge and understanding of Target Heart Rate Range (THRR)  Yes       Intervention  Provide education and explanation of THRR including how the numbers were  predicted and where they are located for reference       Expected Outcomes  Short Term: Able to state/look up THRR;Short Term: Able to use daily as guideline for intensity in rehab;Long Term: Able to use THRR to govern intensity when exercising independently       Understanding of Exercise Prescription  Yes       Intervention  Provide education, explanation, and written materials on patient's individual exercise prescription       Expected Outcomes  Short Term: Able to explain program exercise prescription;Long Term: Able to explain home exercise prescription to exercise independently          Nutrition & Weight - Outcomes: Pre Biometrics - 02/16/17 1115      Pre Biometrics   Grip Strength  23 kg        Nutrition: Nutrition Therapy & Goals - 03/16/17 1210      Nutrition Therapy   Diet  General, healthful       Personal Nutrition Goals   Nutrition Goal  Describe the benefit of including fruits, vegetables, whole grains, and low-fat dairy products in a healthy meal plan.      Intervention Plan   Intervention  Prescribe, educate and counsel regarding individualized specific dietary modifications aiming towards targeted core components such as weight, hypertension, lipid management, diabetes, heart failure and other comorbidities.    Expected Outcomes  Short Term Goal: Understand basic principles of dietary content, such as calories, fat, sodium, cholesterol and nutrients.;Long Term Goal: Adherence to prescribed nutrition plan.       Nutrition Discharge: Nutrition Assessments - 03/16/17 1208      Rate Your Plate Scores   Pre Score  55       Education Questionnaire Score: Knowledge Questionnaire Score - 03/07/17 1154      Knowledge Questionnaire Score   Pre Score  -- Test reviewed with daughter pt has dementia       Goals reviewed with patient; copy given to patient.

## 2017-03-26 NOTE — Progress Notes (Signed)
Pulmonary Individual Treatment Plan  Patient Details  Name: Carol Monroe MRN: [id number] Date of Birth: 1940-11-30 Referring Provider:     Pulmonary Rehab Walk Test from 02/20/2017 in [place]  Referring Provider  Dr. Vaughan Browner      Initial Encounter Date:    Pulmonary Rehab Walk Test from 02/20/2017 in [place]  Date  02/13/17  Referring Provider  Dr. Vaughan Browner      Visit Diagnosis: Chronic obstructive pulmonary disease, unspecified COPD type ([place])  Patient's Home Medications on Admission:   Current Outpatient Medications:  .  albuterol (PROVENTIL HFA;VENTOLIN HFA) 108 (90 Base) MCG/ACT inhaler, Inhale 2 puffs into the lungs every 6 (six) hours as needed for wheezing or shortness of breath., Disp: 1 Inhaler, Rfl: 2 .  atorvastatin (LIPITOR) 10 MG tablet, Take 1 tablet (10 mg total) every evening by mouth., Disp: 90 tablet, Rfl: 1 .  carvedilol (COREG) 25 MG tablet, TAKE 1 TABLET (25 MG TOTAL) BY MOUTH 2 (TWO) TIMES DAILY WITH A MEAL., Disp: 180 tablet, Rfl: 1 .  chlorthalidone (HYGROTON) 25 MG tablet, Take one tablet by mouth once every morning, Disp: 90 tablet, Rfl: 3 .  CREON 24000-76000 units CPEP, TAKE ONE CAPSULE BY MOUTH 3 TIMES A DAY WITH MEALS, Disp: 90 capsule, Rfl: 1 .  diclofenac sodium (VOLTAREN) 1 % GEL, Apply 2 g topically daily., Disp: , Rfl:  .  KLOR-CON M20 20 MEQ tablet, TAKE 1 TABLET EVERY DAY ON MON,WED,FRI AND SAT, Disp: 48 tablet, Rfl: 1 .  lisinopril (PRINIVIL,ZESTRIL) 40 MG tablet, TAKE 1 TABLET BY MOUTH EVERY DAY, Disp: 30 tablet, Rfl: 3 .  NAMZARIC 28-10 MG CP24, USE AS DIRECTED 1 CAPSULE IN THE MOUTH OR THROAT DAILY., Disp: 90 capsule, Rfl: 1 .  Tiotropium Bromide-Olodaterol (STIOLTO RESPIMAT) 2.5-2.5 MCG/ACT AERS, Inhale 2 puffs into the lungs daily., Disp: 1 Inhaler, Rfl: 5  Past Medical History: Past Medical History:  Diagnosis Date  . CHF (congestive heart failure) ([place])   . COPD (chronic  obstructive pulmonary disease) ([place])   . Dementia   . Heart attack ([place])   . History of falling   . Hypertension   . Murmur, heart   . Stroke [facility])     Tobacco Use: Social History   Tobacco Use  Smoking Status Former Smoker  . Packs/day: 2.00  . Years: 40.00  . Pack years: 80.00  . Types: Cigarettes  . Last attempt to quit: 01/24/2006  . Years since quitting: 11.1  Smokeless Tobacco Never Used    Labs: Recent Review Flowsheet Data    Labs for ITP Cardiac and Pulmonary Rehab Latest Ref Rng & Units 10/16/2012 07/10/2013 08/20/2014 08/26/2015 06/29/2016   Cholestrol <200 mg/dL 161 158 162 162 171   LDLCALC <100 mg/dL 70 57 62 65 75   HDL >50 mg/dL 75 90 85 80 79   Trlycerides <150 mg/dL 80 53 73 86 87      Capillary Blood Glucose: No results found for: GLUCAP   Pulmonary Assessment Scores: Pulmonary Assessment Scores    Row Name 02/20/17 1617 03/07/17 1153       ADL UCSD   ADL Phase  Entry  Entry    SOB Score total  -  71      CAT Score   CAT Score  -  71 Entry      mMRC Score   mMRC Score  3  -       Pulmonary Function  Assessment: Pulmonary Function Assessment - 02/16/17 1109      Breath   Bilateral Breath Sounds  Clear    Shortness of Breath  Yes;Limiting activity       Exercise Target Goals:    Exercise Program Goal: Individual exercise prescription set using results from initial 6 min walk test and THRR while considering  patient's activity barriers and safety.    Exercise Prescription Goal: Initial exercise prescription builds to 30-45 minutes a day of aerobic activity, 2-3 days per week.  Home exercise guidelines will be given to patient during program as part of exercise prescription that the participant will acknowledge.  Activity Barriers & Risk Stratification: Activity Barriers & Cardiac Risk Stratification - 02/16/17 1049      Activity Barriers & Cardiac Risk Stratification   Activity Barriers  Deconditioning;Shortness of Breath;History of  Falls       6 Minute Walk: 6 Minute Walk    Row Name 02/20/17 1611         6 Minute Walk   Phase  Initial     Distance  700 feet     Walk Time  6 minutes     # of Rest Breaks  0     MPH  1.32     METS  2     RPE  13     Perceived Dyspnea   1     Symptoms  Yes (comment)     Comments  leg fatigue, used wheelchair, not a reliable pulse ox reading     Resting HR  64 bpm     Resting BP  124/82     Resting Oxygen Saturation   95 %     Exercise Oxygen Saturation  during 6 min walk  91 %     Max Ex. HR  99 bpm     Max Ex. BP  146/77     2 Minute Post BP  146/86       Interval HR   1 Minute HR  92     2 Minute HR  94     4 Minute HR  97     5 Minute HR  99     6 Minute HR  96     2 Minute Post HR  67     Interval Heart Rate?  Yes       Interval Oxygen   Interval Oxygen?  Yes     Baseline Oxygen Saturation %  995 %     1 Minute Oxygen Saturation %  85 %     1 Minute Liters of Oxygen  2 L     2 Minute Oxygen Saturation %  91 %     2 Minute Liters of Oxygen  3 L     3 Minute Oxygen Saturation %  3 %     3 Minute Liters of Oxygen  3 L     4 Minute Oxygen Saturation %  94 %     4 Minute Liters of Oxygen  3 L     5 Minute Oxygen Saturation %  97 %     5 Minute Liters of Oxygen  3 L     6 Minute Oxygen Saturation %  95 %     6 Minute Liters of Oxygen  3 L     2 Minute Post Oxygen Saturation %  98 %     2 Minute Post Liters of Oxygen  3 L  Oxygen Initial Assessment: Oxygen Initial Assessment - 02/20/17 1616      Initial 6 min Walk   Oxygen Used  Continuous;E-Tanks    Liters per minute  2 2-3      Program Oxygen Prescription   Program Oxygen Prescription  Continuous;E-Tanks    Liters per minute  -- 2-3      Intervention   Short Term Goals  To learn and exhibit compliance with exercise, home and travel O2 prescription;To learn and understand importance of monitoring SPO2 with pulse oximeter and demonstrate accurate use of the pulse oximeter.;To learn and  understand importance of maintaining oxygen saturations>88%;To learn and demonstrate proper pursed lip breathing techniques or other breathing techniques.;To learn and demonstrate proper use of respiratory medications    Long  Term Goals  Exhibits compliance with exercise, home and travel O2 prescription;Verbalizes importance of monitoring SPO2 with pulse oximeter and return demonstration;Maintenance of O2 saturations>88%;Exhibits proper breathing techniques, such as pursed lip breathing or other method taught during program session;Compliance with respiratory medication;Demonstrates proper use of MDI's       Oxygen Re-Evaluation: Oxygen Re-Evaluation    Row Name 03/02/17 1019             Program Oxygen Prescription   Program Oxygen Prescription  Continuous;E-Tanks       Liters per minute  2         Home Oxygen   Home Oxygen Device  Home Concentrator;E-Tanks       Sleep Oxygen Prescription  Continuous       Liters per minute  2       Home Exercise Oxygen Prescription  Continuous       Liters per minute  2       Home at Rest Exercise Oxygen Prescription  None       Compliance with Home Oxygen Use  Yes         Goals/Expected Outcomes   Short Term Goals  To learn and exhibit compliance with exercise, home and travel O2 prescription;To learn and understand importance of monitoring SPO2 with pulse oximeter and demonstrate accurate use of the pulse oximeter.;To learn and understand importance of maintaining oxygen saturations>88%;To learn and demonstrate proper pursed lip breathing techniques or other breathing techniques.;To learn and demonstrate proper use of respiratory medications       Long  Term Goals  Exhibits compliance with exercise, home and travel O2 prescription;Verbalizes importance of monitoring SPO2 with pulse oximeter and return demonstration;Maintenance of O2 saturations>88%;Exhibits proper breathing techniques, such as pursed lip breathing or other method taught during  program session;Compliance with respiratory medication;Demonstrates proper use of MDI's          Oxygen Discharge (Final Oxygen Re-Evaluation): Oxygen Re-Evaluation - 03/02/17 1019      Program Oxygen Prescription   Program Oxygen Prescription  Continuous;E-Tanks    Liters per minute  2      Home Oxygen   Home Oxygen Device  Home Concentrator;E-Tanks    Sleep Oxygen Prescription  Continuous    Liters per minute  2    Home Exercise Oxygen Prescription  Continuous    Liters per minute  2    Home at Rest Exercise Oxygen Prescription  None    Compliance with Home Oxygen Use  Yes      Goals/Expected Outcomes   Short Term Goals  To learn and exhibit compliance with exercise, home and travel O2 prescription;To learn and understand importance of monitoring SPO2 with pulse oximeter and demonstrate accurate use  of the pulse oximeter.;To learn and understand importance of maintaining oxygen saturations>88%;To learn and demonstrate proper pursed lip breathing techniques or other breathing techniques.;To learn and demonstrate proper use of respiratory medications    Long  Term Goals  Exhibits compliance with exercise, home and travel O2 prescription;Verbalizes importance of monitoring SPO2 with pulse oximeter and return demonstration;Maintenance of O2 saturations>88%;Exhibits proper breathing techniques, such as pursed lip breathing or other method taught during program session;Compliance with respiratory medication;Demonstrates proper use of MDI's       Initial Exercise Prescription: Initial Exercise Prescription - 02/20/17 1600      Date of Initial Exercise RX and Referring Provider   Date  02/13/17    Referring Provider  Dr. Vaughan Browner      Oxygen   Oxygen  Continuous    Liters  -- 2-3      Recumbant Bike   Level  1    Minutes  17      NuStep   Level  2    SPM  80    Minutes  17    METs  1.5      Track   Laps  5    Minutes  17      Prescription Details   Frequency (times per  week)  2    Duration  Progress to 45 minutes of aerobic exercise without signs/symptoms of physical distress      Intensity   THRR 40-80% of Max Heartrate  58-116    Ratings of Perceived Exertion  11-13    Perceived Dyspnea  0-4      Progression   Progression  Continue progressive overload as per policy without signs/symptoms or physical distress.      Resistance Training   Training Prescription  Yes    Weight  orange bands    Reps  10-15       Perform Capillary Blood Glucose checks as needed.  Exercise Prescription Changes: Exercise Prescription Changes    Row Name 03/08/17 1617             Response to Exercise   Blood Pressure (Admit)  120/84       Blood Pressure (Exercise)  110/80       Blood Pressure (Exit)  134/80       Heart Rate (Admit)  86 bpm       Heart Rate (Exercise)  84 bpm       Heart Rate (Exit)  89 bpm       Oxygen Saturation (Admit)  97 %       Oxygen Saturation (Exercise)  95 %       Oxygen Saturation (Exit)  96 %       Rating of Perceived Exertion (Exercise)  13       Perceived Dyspnea (Exercise)  2       Duration  Progress to 45 minutes of aerobic exercise without signs/symptoms of physical distress       Intensity  Other (comment) 40-80% HRR         Progression   Progression  Continue to progress workloads to maintain intensity without signs/symptoms of physical distress.         Resistance Training   Training Prescription  Yes       Weight  orange bands       Reps  10-15         Oxygen   Oxygen  Continuous       Liters  2-3  Recumbant Bike   Level  1       Minutes  17         NuStep   Level  2       SPM  80       Minutes  17          Exercise Comments:   Exercise Goals and Review: Exercise Goals    Row Name 02/16/17 1051             Exercise Goals   Increase Physical Activity  Yes       Intervention  Provide advice, education, support and counseling about physical activity/exercise needs.;Develop an  individualized exercise prescription for aerobic and resistive training based on initial evaluation findings, risk stratification, comorbidities and participant's personal goals.       Expected Outcomes  Short Term: Attend rehab on a regular basis to increase amount of physical activity.       Increase Strength and Stamina  Yes       Intervention  Provide advice, education, support and counseling about physical activity/exercise needs.;Develop an individualized exercise prescription for aerobic and resistive training based on initial evaluation findings, risk stratification, comorbidities and participant's personal goals.       Expected Outcomes  Short Term: Increase workloads from initial exercise prescription for resistance, speed, and METs.;Short Term: Perform resistance training exercises routinely during rehab and add in resistance training at home;Long Term: Improve cardiorespiratory fitness, muscular endurance and strength as measured by increased METs and functional capacity (6MWT)       Able to understand and use rate of perceived exertion (RPE) scale  Yes       Intervention  Provide education and explanation on how to use RPE scale       Expected Outcomes  Short Term: Able to use RPE daily in rehab to express subjective intensity level;Long Term:  Able to use RPE to guide intensity level when exercising independently       Able to understand and use Dyspnea scale  Yes       Intervention  Provide education and explanation on how to use Dyspnea scale       Expected Outcomes  Short Term: Able to use Dyspnea scale daily in rehab to express subjective sense of shortness of breath during exertion;Long Term: Able to use Dyspnea scale to guide intensity level when exercising independently       Knowledge and understanding of Target Heart Rate Range (THRR)  Yes       Intervention  Provide education and explanation of THRR including how the numbers were predicted and where they are located for reference        Expected Outcomes  Short Term: Able to state/look up THRR;Short Term: Able to use daily as guideline for intensity in rehab;Long Term: Able to use THRR to govern intensity when exercising independently       Understanding of Exercise Prescription  Yes       Intervention  Provide education, explanation, and written materials on patient's individual exercise prescription       Expected Outcomes  Short Term: Able to explain program exercise prescription;Long Term: Able to explain home exercise prescription to exercise independently          Exercise Goals Re-Evaluation : Exercise Goals Re-Evaluation    Row Name 03/02/17 1020             Exercise Goal Re-Evaluation   Exercise Goals Review  Increase Physical Activity;Able to  understand and use rate of perceived exertion (RPE) scale;Knowledge and understanding of Target Heart Rate Range (THRR);Understanding of Exercise Prescription;Increase Strength and Stamina;Able to understand and use Dyspnea scale       Comments  Patient has only attended one rehab session. Will cont. to monitor and motivate.        Expected Outcomes  Through exercise at rehab and at home, patient will increase strength and stamina making ADL's easier to perform. Patient will also have a better understanding of safe exercise and what they are capable to do outside of clinical supervision.          Discharge Exercise Prescription (Final Exercise Prescription Changes): Exercise Prescription Changes - 03/08/17 1617      Response to Exercise   Blood Pressure (Admit)  120/84    Blood Pressure (Exercise)  110/80    Blood Pressure (Exit)  134/80    Heart Rate (Admit)  86 bpm    Heart Rate (Exercise)  84 bpm    Heart Rate (Exit)  89 bpm    Oxygen Saturation (Admit)  97 %    Oxygen Saturation (Exercise)  95 %    Oxygen Saturation (Exit)  96 %    Rating of Perceived Exertion (Exercise)  13    Perceived Dyspnea (Exercise)  2    Duration  Progress to 45 minutes of  aerobic exercise without signs/symptoms of physical distress    Intensity  Other (comment) 40-80% HRR      Progression   Progression  Continue to progress workloads to maintain intensity without signs/symptoms of physical distress.      Resistance Training   Training Prescription  Yes    Weight  orange bands    Reps  10-15      Oxygen   Oxygen  Continuous    Liters  2-3      Recumbant Bike   Level  1    Minutes  17      NuStep   Level  2    SPM  80    Minutes  17       Nutrition:  Target Goals: Understanding of nutrition guidelines, daily intake of sodium <1557m, cholesterol <2027m calories 30% from fat and 7% or less from saturated fats, daily to have 5 or more servings of fruits and vegetables.  Biometrics: Pre Biometrics - 02/16/17 1115      Pre Biometrics   Grip Strength  23 kg        Nutrition Therapy Plan and Nutrition Goals: Nutrition Therapy & Goals - 03/16/17 1210      Nutrition Therapy   Diet  General, healthful       Personal Nutrition Goals   Nutrition Goal  Describe the benefit of including fruits, vegetables, whole grains, and low-fat dairy products in a healthy meal plan.      Intervention Plan   Intervention  Prescribe, educate and counsel regarding individualized specific dietary modifications aiming towards targeted core components such as weight, hypertension, lipid management, diabetes, heart failure and other comorbidities.    Expected Outcomes  Short Term Goal: Understand basic principles of dietary content, such as calories, fat, sodium, cholesterol and nutrients.;Long Term Goal: Adherence to prescribed nutrition plan.       Nutrition Assessments: Nutrition Assessments - 03/16/17 1208      Rate Your Plate Scores   Pre Score  55       Nutrition Goals Re-Evaluation:   Nutrition Goals Discharge (Final Nutrition Goals Re-Evaluation):  Psychosocial: Target Goals: Acknowledge presence or absence of significant depression and/or  stress, maximize coping skills, provide positive support system. Participant is able to verbalize types and ability to use techniques and skills needed for reducing stress and depression.  Initial Review & Psychosocial Screening: Initial Psych Review & Screening - 02/16/17 1109      Initial Review   Current issues with  -- Dementia      Family Dynamics   Good Support System?  Yes      Barriers   Psychosocial barriers to participate in program  There are no identifiable barriers or psychosocial needs.;The patient should benefit from training in stress management and relaxation.      Screening Interventions   Interventions  Encouraged to exercise       Quality of Life Scores:  Scores of 19 and below usually indicate a poorer quality of life in these areas.  A difference of  2-3 points is a clinically meaningful difference.  A difference of 2-3 points in the total score of the Quality of Life Index has been associated with significant improvement in overall quality of life, self-image, physical symptoms, and general health in studies assessing change in quality of life.   PHQ-9: Recent Review Flowsheet Data    Depression screen [facility] 2/9 02/16/2017 02/21/2016 08/26/2015 03/23/2015 08/20/2014   Decreased Interest 0 0 0 0 0   Down, Depressed, Hopeless 0 0 0 0 0   PHQ - 2 Score 0 0 0 0 0     Interpretation of Total Score  Total Score Depression Severity:  1-4 = Minimal depression, 5-9 = Mild depression, 10-14 = Moderate depression, 15-19 = Moderately severe depression, 20-27 = Severe depression   Psychosocial Evaluation and Intervention: Psychosocial Evaluation - 02/16/17 1110      Psychosocial Evaluation & Interventions   Interventions  Encouraged to exercise with the program and follow exercise prescription    Expected Outcomes  patient will remain free from psychosocial barriers to participation in pulmonary rehab    Continue Psychosocial Services   No Follow up required        Psychosocial Re-Evaluation: Psychosocial Re-Evaluation    [place] Name 03/06/17 1023             Psychosocial Re-Evaluation   Current issues with  None Identified       Expected Outcomes  no barriers to participation in pulmonary rehab       Interventions  Encouraged to attend Pulmonary Rehabilitation for the exercise       Continue Psychosocial Services   No Follow up required          Psychosocial Discharge (Final Psychosocial Re-Evaluation): Psychosocial Re-Evaluation - 03/06/17 1023      Psychosocial Re-Evaluation   Current issues with  None Identified    Expected Outcomes  no barriers to participation in pulmonary rehab    Interventions  Encouraged to attend Pulmonary Rehabilitation for the exercise    Continue Psychosocial Services   No Follow up required       Education: Education Goals: Education classes will be provided on a weekly basis, covering required topics. Participant will state understanding/return demonstration of topics presented.  Learning Barriers/Preferences: Learning Barriers/Preferences - 02/16/17 1101      Learning Barriers/Preferences   Learning Barriers  Inability to learn new things    Learning Preferences  Individual Instruction       Education Topics: Risk Factor Reduction:  -Group instruction that is supported by a PowerPoint presentation. Instructor discusses  the definition of a risk factor, different risk factors for pulmonary disease, and how the heart and lungs work together.     Nutrition for Pulmonary Patient:  -Group instruction provided by PowerPoint slides, verbal discussion, and written materials to support subject matter. The instructor gives an explanation and review of healthy diet recommendations, which includes a discussion on weight management, recommendations for fruit and vegetable consumption, as well as protein, fluid, caffeine, fiber, sodium, sugar, and alcohol. Tips for eating when patients are short of breath are  discussed.   Pursed Lip Breathing:  -Group instruction that is supported by demonstration and informational handouts. Instructor discusses the benefits of pursed lip and diaphragmatic breathing and detailed demonstration on how to preform both.     Oxygen Safety:  -Group instruction provided by PowerPoint, verbal discussion, and written material to support subject matter. There is an overview of "What is Oxygen" and "Why do we need it".  Instructor also reviews how to create a safe environment for oxygen use, the importance of using oxygen as prescribed, and the risks of noncompliance. There is a brief discussion on traveling with oxygen and resources the patient may utilize.   PULMONARY REHAB CHRONIC OBSTRUCTIVE PULMONARY DISEASE from 03/22/2017 in [place]  Date  03/01/17  Educator  Truddie Crumble  Instruction Review Code  2- Demonstrated Understanding      Oxygen Equipment:  -Group instruction provided by [facility] Staff utilizing handouts, written materials, and equipment demonstrations.   PULMONARY REHAB CHRONIC OBSTRUCTIVE PULMONARY DISEASE from 03/22/2017 in [place]  Date  03/08/17  Educator  [facility]  Instruction Review Code  1- Verbalizes Understanding      Signs and Symptoms:  -Group instruction provided by written material and verbal discussion to support subject matter. Warning signs and symptoms of infection, stroke, and heart attack are reviewed and when to call the physician/911 reinforced. Tips for preventing the spread of infection discussed.   PULMONARY REHAB CHRONIC OBSTRUCTIVE PULMONARY DISEASE from 03/22/2017 in [place]  Date  03/22/17  Educator  rn  Instruction Review Code  2- Demonstrated Understanding      Advanced Directives:  -Group instruction provided by verbal instruction and written material to support subject matter. Instructor reviews Advanced  Directive laws and proper instruction for filling out document.   Pulmonary Video:  -Group video education that reviews the importance of medication and oxygen compliance, exercise, good nutrition, pulmonary hygiene, and pursed lip and diaphragmatic breathing for the pulmonary patient.   Exercise for the Pulmonary Patient:  -Group instruction that is supported by a PowerPoint presentation. Instructor discusses benefits of exercise, core components of exercise, frequency, duration, and intensity of an exercise routine, importance of utilizing pulse oximetry during exercise, safety while exercising, and options of places to exercise outside of rehab.     Pulmonary Medications:  -Verbally interactive group education provided by instructor with focus on inhaled medications and proper administration.   Anatomy and Physiology of the Respiratory System and Intimacy:  -Group instruction provided by PowerPoint, verbal discussion, and written material to support subject matter. Instructor reviews respiratory cycle and anatomical components of the respiratory system and their functions. Instructor also reviews differences in obstructive and restrictive respiratory diseases with examples of each. Intimacy, Sex, and Sexuality differences are reviewed with a discussion on how relationships can change when diagnosed with pulmonary disease. Common sexual concerns are reviewed.   MD DAY -A group question and answer session  with a medical doctor that allows participants to ask questions that relate to their pulmonary disease state.   OTHER EDUCATION -Group or individual verbal, written, or video instructions that support the educational goals of the pulmonary rehab program.   Holiday Eating Survival Tips:  -Group instruction provided by PowerPoint slides, verbal discussion, and written materials to support subject matter. The instructor gives patients tips, tricks, and techniques to help them not only  survive but enjoy the holidays despite the onslaught of food that accompanies the holidays.   Knowledge Questionnaire Score: Knowledge Questionnaire Score - 03/07/17 1154      Knowledge Questionnaire Score   Pre Score  -- Test reviewed with daughter pt has dementia       Core Components/Risk Factors/Patient Goals at Admission: Personal Goals and Risk Factors at Admission - 02/16/17 1109      Core Components/Risk Factors/Patient Goals on Admission   Improve shortness of breath with ADL's  Yes    Intervention  Provide education, individualized exercise plan and daily activity instruction to help decrease symptoms of SOB with activities of daily living.    Expected Outcomes  Short Term: Improve cardiorespiratory fitness to achieve a reduction of symptoms when performing ADLs;Long Term: Be able to perform more ADLs without symptoms or delay the onset of symptoms       Core Components/Risk Factors/Patient Goals Review:  Goals and Risk Factor Review    Row Name 03/06/17 1022             Core Components/Risk Factors/Patient Goals Review   Personal Goals Review  Improve shortness of breath with ADL's       Review  Just started program, too early to see improvement       Expected Outcomes  see admission goals          Core Components/Risk Factors/Patient Goals at Discharge (Final Review):  Goals and Risk Factor Review - 03/06/17 1022      Core Components/Risk Factors/Patient Goals Review   Personal Goals Review  Improve shortness of breath with ADL's    Review  Just started program, too early to see improvement    Expected Outcomes  see admission goals       ITP Comments:   Comments: ITP REVIEW Pt is making expected progress toward pulmonary rehab goals after completing 5 sessions. She is moving back to [place] to live with another daughter and is being discharged from pulmonary rehab.  She and her daughter were given written exercise instructions for her home use.

## 2017-03-27 ENCOUNTER — Encounter: Payer: Self-pay | Admitting: Nurse Practitioner

## 2017-03-27 ENCOUNTER — Encounter (HOSPITAL_COMMUNITY): Payer: Medicare Other

## 2017-03-29 ENCOUNTER — Encounter (HOSPITAL_COMMUNITY): Payer: Medicare Other

## 2017-03-30 ENCOUNTER — Encounter: Payer: Self-pay | Admitting: Nurse Practitioner

## 2017-04-03 ENCOUNTER — Encounter (HOSPITAL_COMMUNITY): Payer: Medicare Other

## 2017-04-05 ENCOUNTER — Encounter (HOSPITAL_COMMUNITY): Payer: Medicare Other

## 2017-04-10 ENCOUNTER — Encounter (HOSPITAL_COMMUNITY): Payer: Medicare Other

## 2017-04-10 ENCOUNTER — Other Ambulatory Visit: Payer: Self-pay | Admitting: *Deleted

## 2017-04-10 DIAGNOSIS — I10 Essential (primary) hypertension: Secondary | ICD-10-CM

## 2017-04-10 DIAGNOSIS — I509 Heart failure, unspecified: Secondary | ICD-10-CM

## 2017-04-10 MED ORDER — LISINOPRIL 40 MG PO TABS: 40.0000 mg | ORAL_TABLET | Freq: Every day | ORAL | 1 refills | Status: AC

## 2017-04-10 NOTE — Telephone Encounter (Signed)
[facility] 

## 2017-04-12 ENCOUNTER — Encounter (HOSPITAL_COMMUNITY): Payer: Medicare Other

## 2017-04-17 ENCOUNTER — Encounter (HOSPITAL_COMMUNITY): Payer: Medicare Other

## 2017-04-19 ENCOUNTER — Encounter (HOSPITAL_COMMUNITY): Payer: Medicare Other

## 2017-04-24 ENCOUNTER — Encounter (HOSPITAL_COMMUNITY): Payer: Medicare Other

## 2017-04-26 ENCOUNTER — Encounter (HOSPITAL_COMMUNITY): Payer: Medicare Other

## 2017-05-01 ENCOUNTER — Encounter (HOSPITAL_COMMUNITY): Payer: Medicare Other

## 2017-05-01 ENCOUNTER — Ambulatory Visit: Payer: Medicare Other | Admitting: Nurse Practitioner

## 2017-05-03 ENCOUNTER — Encounter (HOSPITAL_COMMUNITY): Payer: Medicare Other

## 2017-05-08 ENCOUNTER — Encounter (HOSPITAL_COMMUNITY): Payer: Medicare Other

## 2017-05-10 ENCOUNTER — Encounter (HOSPITAL_COMMUNITY): Payer: Medicare Other

## 2017-05-15 ENCOUNTER — Encounter (HOSPITAL_COMMUNITY): Payer: Medicare Other

## 2017-05-17 ENCOUNTER — Encounter (HOSPITAL_COMMUNITY): Payer: Medicare Other

## 2017-05-22 ENCOUNTER — Encounter (HOSPITAL_COMMUNITY): Payer: Medicare Other

## 2017-05-24 ENCOUNTER — Encounter (HOSPITAL_COMMUNITY): Payer: Medicare Other

## 2017-05-29 ENCOUNTER — Encounter (HOSPITAL_COMMUNITY): Payer: Medicare Other

## 2017-05-31 ENCOUNTER — Encounter (HOSPITAL_COMMUNITY): Payer: Medicare Other

## 2017-06-05 ENCOUNTER — Encounter (HOSPITAL_COMMUNITY): Payer: Medicare Other

## 2017-06-07 ENCOUNTER — Encounter (HOSPITAL_COMMUNITY): Payer: Medicare Other

## 2017-06-12 ENCOUNTER — Encounter (HOSPITAL_COMMUNITY): Payer: Medicare Other

## 2017-06-14 ENCOUNTER — Encounter (HOSPITAL_COMMUNITY): Payer: Medicare Other

## 2017-07-04 DIAGNOSIS — Z0000 Encounter for general adult medical examination without abnormal findings: Secondary | ICD-10-CM | POA: Diagnosis not present

## 2017-07-04 DIAGNOSIS — E782 Mixed hyperlipidemia: Secondary | ICD-10-CM | POA: Diagnosis not present

## 2017-07-04 DIAGNOSIS — E663 Overweight: Secondary | ICD-10-CM | POA: Diagnosis not present

## 2017-07-04 DIAGNOSIS — Z6826 Body mass index (BMI) 26.0-26.9, adult: Secondary | ICD-10-CM | POA: Diagnosis not present

## 2017-08-20 DIAGNOSIS — Z111 Encounter for screening for respiratory tuberculosis: Secondary | ICD-10-CM | POA: Diagnosis not present

## 2017-08-20 DIAGNOSIS — J449 Chronic obstructive pulmonary disease, unspecified: Secondary | ICD-10-CM | POA: Diagnosis not present

## 2017-08-20 DIAGNOSIS — R21 Rash and other nonspecific skin eruption: Secondary | ICD-10-CM | POA: Diagnosis not present

## 2017-09-10 ENCOUNTER — Ambulatory Visit: Payer: Medicare Other | Admitting: Nurse Practitioner

## 2017-09-10 ENCOUNTER — Ambulatory Visit: Payer: Medicare Other

## 2017-09-13 ENCOUNTER — Other Ambulatory Visit: Payer: Self-pay | Admitting: Internal Medicine

## 2017-09-13 DIAGNOSIS — F0151 Vascular dementia with behavioral disturbance: Secondary | ICD-10-CM

## 2017-09-13 DIAGNOSIS — F01518 Vascular dementia, unspecified severity, with other behavioral disturbance: Secondary | ICD-10-CM

## 2017-10-03 DIAGNOSIS — I700 Atherosclerosis of aorta: Secondary | ICD-10-CM | POA: Diagnosis not present

## 2017-10-03 DIAGNOSIS — R0602 Shortness of breath: Secondary | ICD-10-CM | POA: Diagnosis not present

## 2017-10-03 DIAGNOSIS — Z87891 Personal history of nicotine dependence: Secondary | ICD-10-CM | POA: Diagnosis not present

## 2017-10-03 DIAGNOSIS — F0390 Unspecified dementia without behavioral disturbance: Secondary | ICD-10-CM | POA: Diagnosis not present

## 2017-10-03 DIAGNOSIS — J441 Chronic obstructive pulmonary disease with (acute) exacerbation: Secondary | ICD-10-CM | POA: Diagnosis not present

## 2017-10-08 DIAGNOSIS — E782 Mixed hyperlipidemia: Secondary | ICD-10-CM | POA: Diagnosis not present

## 2017-10-08 DIAGNOSIS — J449 Chronic obstructive pulmonary disease, unspecified: Secondary | ICD-10-CM | POA: Diagnosis not present

## 2017-10-08 DIAGNOSIS — E663 Overweight: Secondary | ICD-10-CM | POA: Diagnosis not present

## 2017-10-08 DIAGNOSIS — I10 Essential (primary) hypertension: Secondary | ICD-10-CM | POA: Diagnosis not present

## 2017-10-08 DIAGNOSIS — Z23 Encounter for immunization: Secondary | ICD-10-CM | POA: Diagnosis not present

## 2017-10-12 ENCOUNTER — Ambulatory Visit: Payer: Medicare Other | Admitting: Nurse Practitioner

## 2017-10-15 ENCOUNTER — Ambulatory Visit: Payer: Medicare Other

## 2017-10-26 ENCOUNTER — Ambulatory Visit: Payer: Medicare Other

## 2017-10-26 ENCOUNTER — Encounter: Payer: Medicare Other | Admitting: Nurse Practitioner

## 2017-11-20 DIAGNOSIS — R0902 Hypoxemia: Secondary | ICD-10-CM | POA: Diagnosis not present

## 2017-11-20 DIAGNOSIS — J449 Chronic obstructive pulmonary disease, unspecified: Secondary | ICD-10-CM | POA: Diagnosis not present

## 2017-11-27 ENCOUNTER — Telehealth: Payer: Self-pay

## 2017-11-27 NOTE — Telephone Encounter (Signed)
-----   Message from Lauree Chandler, NP sent at 11/27/2017 10:33 AM EST ----- Is pt going to another PCP we have no seen her in over a year  ----- Message ----- From: SYSTEM Sent: 11/27/2017  12:09 AM EST To: Lauree Chandler, NP

## 2017-11-27 NOTE — Telephone Encounter (Signed)
I left a message asking that patient's daughter call the office to confirm whether or not patient intended to remain with our practice. Patient has not been seen since 08/2016 and has no pending appt

## 2017-12-04 NOTE — Telephone Encounter (Signed)
Per appointment cancellation note on 10/25/17, pt no longer lives in [place] name will be removed from patient's chart.

## 2017-12-06 IMAGING — DX DG CHEST 2V
2 series · 2 of 2 positions shown · non-contrast
Comparison: 07/29/2014

CLINICAL DATA: Chronic shortness of breath, hypertension, former
smoker, COPD, CHF, prior MI

EXAM:
CHEST  2 VIEW

[chest pa]
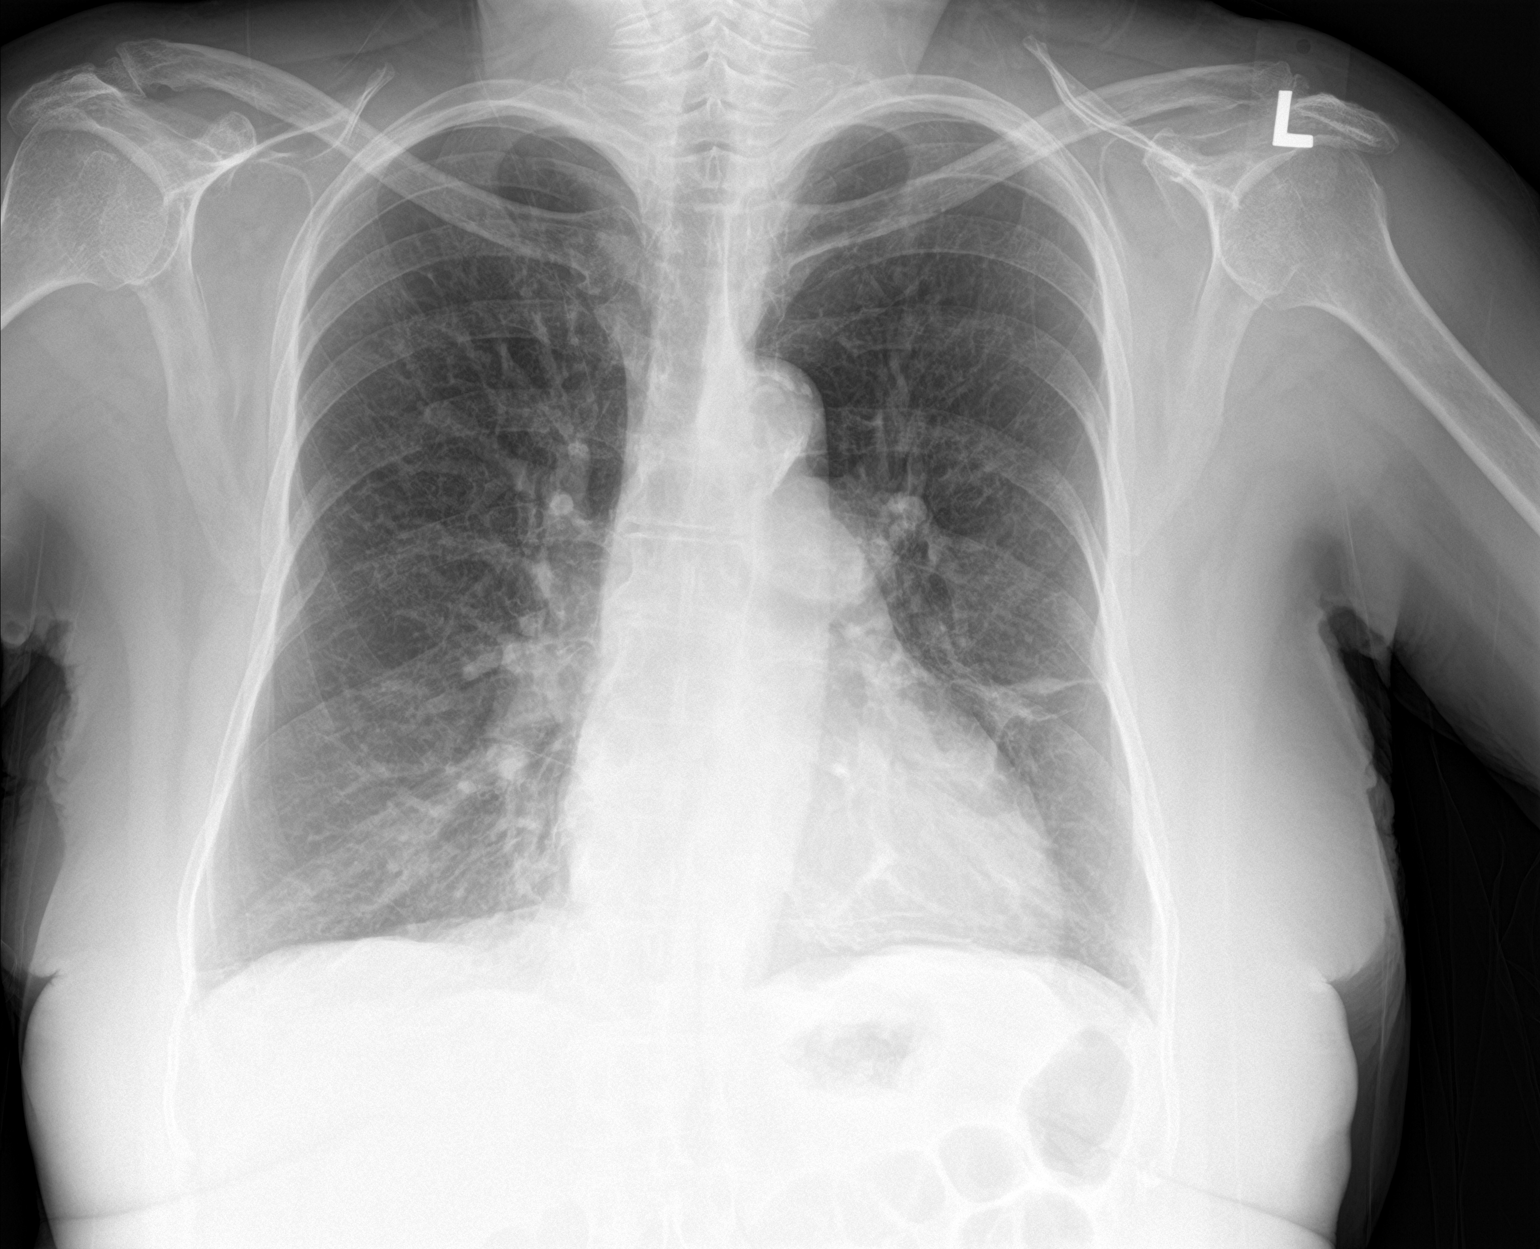

[chest lat]
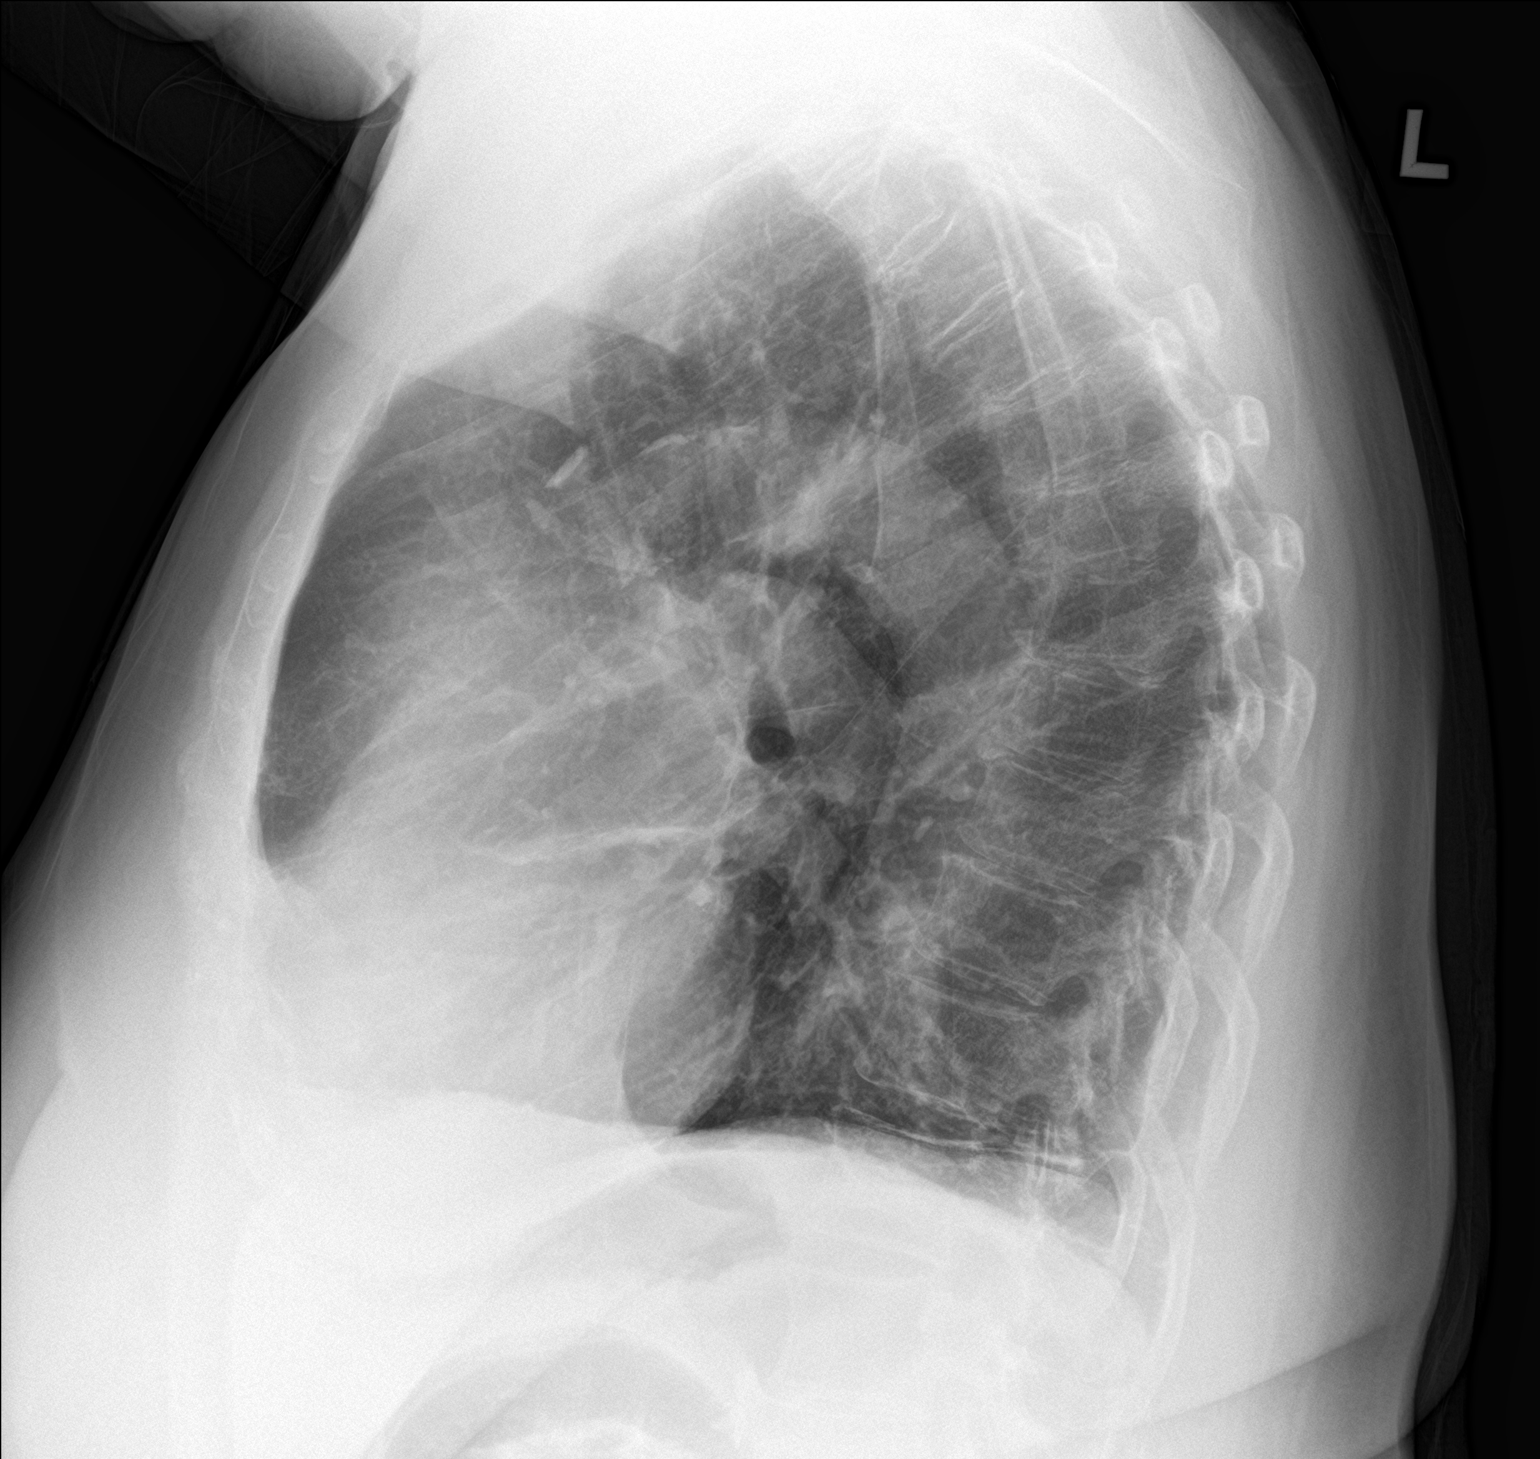

[2 of 2 positions shown; findings below may reference images not displayed]

FINDINGS: Minimal enlargement of cardiac silhouette.

Atherosclerotic calcification aorta.

Enlarged central pulmonary arteries unchanged.

Linear scarring in lingula unchanged.

Subsegmental atelectasis LEFT base.

Lungs otherwise mildly hyperexpanded and clear.

No definite segmental infiltrate, pleural effusion or pneumothorax.

Bones demineralized without acute osseous findings.
IMPRESSION: Lingular scarring and LEFT basilar atelectasis without acute
infiltrate.

Minimal enlargement of cardiac silhouette.

## 2017-12-13 DIAGNOSIS — J449 Chronic obstructive pulmonary disease, unspecified: Secondary | ICD-10-CM | POA: Diagnosis not present

## 2018-01-08 DIAGNOSIS — J449 Chronic obstructive pulmonary disease, unspecified: Secondary | ICD-10-CM | POA: Diagnosis not present

## 2018-01-08 DIAGNOSIS — I10 Essential (primary) hypertension: Secondary | ICD-10-CM | POA: Diagnosis not present

## 2018-01-08 DIAGNOSIS — E663 Overweight: Secondary | ICD-10-CM | POA: Diagnosis not present

## 2018-01-08 DIAGNOSIS — E782 Mixed hyperlipidemia: Secondary | ICD-10-CM | POA: Diagnosis not present

## 2018-04-04 DIAGNOSIS — I10 Essential (primary) hypertension: Secondary | ICD-10-CM | POA: Diagnosis not present

## 2018-04-04 DIAGNOSIS — J449 Chronic obstructive pulmonary disease, unspecified: Secondary | ICD-10-CM | POA: Diagnosis not present

## 2018-04-04 DIAGNOSIS — Z1339 Encounter for screening examination for other mental health and behavioral disorders: Secondary | ICD-10-CM | POA: Diagnosis not present

## 2018-04-04 DIAGNOSIS — Z1331 Encounter for screening for depression: Secondary | ICD-10-CM | POA: Diagnosis not present
